# Patient Record
Sex: Female | Born: 1955 | Hispanic: No | Marital: Married | Smoking: Never smoker
Health system: Southern US, Community
[De-identification: ages and names within clinical notes are randomized; demographics above are authoritative.]

## PROBLEM LIST (undated history)

## (undated) DIAGNOSIS — R011 Cardiac murmur, unspecified: Secondary | ICD-10-CM

## (undated) HISTORY — PX: BUNIONECTOMY: SHX129

## (undated) HISTORY — PX: OTHER SURGICAL HISTORY: SHX169

## (undated) HISTORY — PX: KNEE SURGERY: SHX244

---

## 2004-03-17 ENCOUNTER — Ambulatory Visit: Payer: Self-pay | Admitting: Obstetrics & Gynecology

## 2004-06-16 ENCOUNTER — Ambulatory Visit: Payer: Self-pay | Admitting: Internal Medicine

## 2004-08-20 ENCOUNTER — Ambulatory Visit: Payer: Self-pay | Admitting: Obstetrics & Gynecology

## 2005-09-08 ENCOUNTER — Ambulatory Visit: Payer: Self-pay | Admitting: Obstetrics & Gynecology

## 2006-09-14 ENCOUNTER — Ambulatory Visit: Payer: Self-pay | Admitting: Obstetrics & Gynecology

## 2007-11-29 ENCOUNTER — Ambulatory Visit: Payer: Self-pay | Admitting: Obstetrics & Gynecology

## 2009-04-17 ENCOUNTER — Ambulatory Visit: Payer: Self-pay | Admitting: Obstetrics & Gynecology

## 2009-04-22 ENCOUNTER — Ambulatory Visit: Payer: Self-pay | Admitting: Specialist

## 2009-05-07 ENCOUNTER — Ambulatory Visit: Payer: Self-pay | Admitting: Specialist

## 2010-05-20 ENCOUNTER — Ambulatory Visit: Payer: Self-pay | Admitting: Obstetrics & Gynecology

## 2013-07-02 ENCOUNTER — Observation Stay: Payer: Self-pay | Admitting: Internal Medicine

## 2013-07-02 LAB — CBC
HCT: 40.2 % (ref 35.0–47.0)
HGB: 13.4 g/dL (ref 12.0–16.0)
MCH: 31 pg (ref 26.0–34.0)
MCHC: 33.4 g/dL (ref 32.0–36.0)
MCV: 93 fL (ref 80–100)
PLATELETS: 302 10*3/uL (ref 150–440)
RBC: 4.33 10*6/uL (ref 3.80–5.20)
RDW: 13.9 % (ref 11.5–14.5)
WBC: 9.7 10*3/uL (ref 3.6–11.0)

## 2013-07-02 LAB — TROPONIN I
Troponin-I: <0.02 ng/mL
Troponin-I: <0.02 ng/mL

## 2013-07-02 LAB — BASIC METABOLIC PANEL
Anion Gap: 3 — ABNORMAL LOW (ref 7–16)
BUN: 9 mg/dL (ref 7–18)
CHLORIDE: 107 mmol/L (ref 98–107)
CO2: 30 mmol/L (ref 21–32)
CREATININE: 0.63 mg/dL (ref 0.60–1.30)
Calcium, Total: 8.8 mg/dL (ref 8.5–10.1)
EGFR (Non-African Amer.): >60
Glucose: 89 mg/dL (ref 65–99)
Osmolality: 278 (ref 275–301)
Potassium: 3.7 mmol/L (ref 3.5–5.1)
Sodium: 140 mmol/L (ref 136–145)

## 2013-07-02 LAB — CK TOTAL AND CKMB (NOT AT ARMC)
CK, TOTAL: 77 U/L
CK, Total: 101 U/L
CK, Total: 84 U/L
CK-MB: 2.7 ng/mL (ref 0.5–3.6)
CK-MB: 2.2 ng/mL (ref 0.5–3.6)
CK-MB: 2 ng/mL (ref 0.5–3.6)

## 2013-07-03 DIAGNOSIS — R079 Chest pain, unspecified: Secondary | ICD-10-CM

## 2013-07-03 LAB — HEMOGLOBIN A1C: Hemoglobin A1C: 6 % (ref 4.2–6.3)

## 2013-07-03 LAB — BASIC METABOLIC PANEL
ANION GAP: 4 — AB (ref 7–16)
BUN: 11 mg/dL (ref 7–18)
CALCIUM: 8.8 mg/dL (ref 8.5–10.1)
Chloride: 109 mmol/L — ABNORMAL HIGH (ref 98–107)
Co2: 28 mmol/L (ref 21–32)
Creatinine: 0.67 mg/dL (ref 0.60–1.30)
EGFR (Non-African Amer.): >60
Glucose: 104 mg/dL — ABNORMAL HIGH (ref 65–99)
Osmolality: 281 (ref 275–301)
Potassium: 3.9 mmol/L (ref 3.5–5.1)
Sodium: 141 mmol/L (ref 136–145)

## 2013-07-03 LAB — TSH: Thyroid Stimulating Horm: 1.63 u[IU]/mL

## 2013-07-03 LAB — URINALYSIS, COMPLETE
Bacteria: NONE SEEN
Bilirubin,UR: NEGATIVE
Blood: NEGATIVE
GLUCOSE, UR: NEGATIVE mg/dL (ref 0–75)
KETONE: NEGATIVE
Leukocyte Esterase: NEGATIVE
Nitrite: NEGATIVE
Ph: 6 (ref 4.5–8.0)
Protein: NEGATIVE
RBC, UR: NONE SEEN /HPF (ref 0–5)
Specific Gravity: 1.008 (ref 1.003–1.030)
WBC UR: NONE SEEN /HPF (ref 0–5)

## 2013-07-03 LAB — CBC WITH DIFFERENTIAL/PLATELET
Basophil #: 0.1 10*3/uL (ref 0.0–0.1)
Basophil %: 1.1 %
EOS ABS: 0.9 10*3/uL — AB (ref 0.0–0.7)
Eosinophil %: 11.2 %
HCT: 39.6 % (ref 35.0–47.0)
HGB: 13.2 g/dL (ref 12.0–16.0)
Lymphocyte #: 2.2 10*3/uL (ref 1.0–3.6)
Lymphocyte %: 29.1 %
MCH: 31.1 pg (ref 26.0–34.0)
MCHC: 33.3 g/dL (ref 32.0–36.0)
MCV: 93 fL (ref 80–100)
MONOS PCT: 6.8 %
Monocyte #: 0.5 x10 3/mm (ref 0.2–0.9)
Neutrophil #: 4 10*3/uL (ref 1.4–6.5)
Neutrophil %: 51.8 %
PLATELETS: 292 10*3/uL (ref 150–440)
RBC: 4.24 10*6/uL (ref 3.80–5.20)
RDW: 14.1 % (ref 11.5–14.5)
WBC: 7.7 10*3/uL (ref 3.6–11.0)

## 2013-07-03 LAB — LIPID PANEL
CHOLESTEROL: 204 mg/dL — AB (ref 0–200)
HDL Cholesterol: 45 mg/dL (ref 40–60)
Ldl Cholesterol, Calc: 119 mg/dL — ABNORMAL HIGH (ref 0–100)
TRIGLYCERIDES: 198 mg/dL (ref 0–200)
VLDL Cholesterol, Calc: 40 mg/dL (ref 5–40)

## 2013-07-03 LAB — MAGNESIUM: Magnesium: 2 mg/dL

## 2014-07-27 NOTE — H&P (Signed)
PATIENT NAME:  Michele Bishop, BRANCA MR#:  [id number] DATE OF BIRTH:  25-Mar-1956  DATE OF ADMISSION:  07/02/2013  REASON FOR ADMISSION: Chest pain.   PRIMARY CARE PHYSICIAN: Benita Stabile, MD  HISTORY OF PRESENT ILLNESS: This is a very nice 59 year old female with history of being overall healthy, just postmenopausal. She is Freight forwarder at [employer] and today  she showed up to work being her normal self, was running trying to complete her and orders and apparently always is very busy and she is on her toes, never sits down, very active. The patient started having significant chest pain that happened around 6:30 a.m. The chest pain was located on her left upper chest radiating to the left shoulder and scapula. The pain resolved after 20 minutes and then continued to have some soreness. The patient had no shortness of breath associated to this, but she was feeling dizzy, lightheaded and very weak, not her normal self. After awhile she started to feel better and the pain relieved. No diaphoresis. No headaches or any other medical problems or problems today. The patient had negative troponins, was admitted for evaluation of acute coronary syndrome or CAD.   REVIEW OF SYSTEMS: A 12-systems review of systems is done.  CONSTITUTIONAL: No fever, fatigue, or weakness.  EYES: No blurry vision, double vision, pain or redness.   EARS, NOSE, THROAT: No tinnitus.  RESPIRATORY: No cough, wheezing, or hemoptysis.  CARDIOVASCULAR: No chest pain, orthopnea, or edema.  GASTROINTESTINAL: No nausea, vomiting, abdominal pain, constipation, diarrhea.  GENITOURINARY: No history of hematuria, or changes in frequency.  ENDOCRINE: No polyuria, polydipsia or polyphagia.  HEMATOLOGIC AND LYMPHATIC: No anemia, easy bruising.  SKIN: No rashes or petechiae.  MUSCULOSKELETAL: No significant neck pain, back pain, or gout.  NEUROLOGIC: No numbness, tingling, or TIAs.  PSYCHIATRIC: No anxiety or depression.   PAST SURGICAL HISTORY:  Left knee surgery due to meniscus tear and tubal ligation.   ALLERGIES: Not known drug allergies.   FAMILY HISTORY: No history of CVAs or MIs. No cancer on her family.   SOCIAL HISTORY: The patient does not smoke, does not drink. She is married. She is very active. She works really hard as a Freight forwarder on a moderately stressful position.   CURRENT MEDICATIONS: Include multivitamins daily and loratadine 10 mg once a day.   PAST MEDICAL HISTORY: The patient is overall healthy, just postmenopausal.   PHYSICAL EXAMINATION:  VITAL SIGNS: Blood pressure 124/75 at the beginning was 185/68, pulse 52. Right now her pulse is 90. Her temperature is 98, oxygen saturation 98% on room air.  GENERAL: The patient is alert, oriented x 3, in no acute distress. No respiratory distress. Hemodynamically stable.  HEENT: Pupils are equal and reactive. Extraocular movements are intact. Mucosa is moist. Anicteric sclerae. Pink conjunctivae. No oral lesions. No oropharyngeal exudates.  NECK: Supple. No JVD. No thyromegaly. No adenopathy. No carotid bruits.  CARDIOVASCULAR: Regular rate and rhythm. No murmurs, rubs, or gallops. No displacement of PMI. Positive reproduction of the pain on palpation at the level of the left chest on a single spot which she is on top of her breast. No displacement of PMI.  LUNGS: Clear without any wheezing or crepitus. No use of accessory muscles.  ABDOMEN: Soft, nontender, distended. No hepatosplenomegaly. No masses. Bowel sounds are positive.  GENITAL: Deferred.  EXTREMITIES: No edema, cyanosis, or clubbing. Pulses +2. Capillary refill less than 3.  NEUROLOGIC: Cranial nerves II through XII intact. Strength is 5 in 4 extremities. No focal findings.  PSYCHIATRIC: No significant depression, depression, or agitation. Alert and oriented x 3.  MUSCULOSKELETAL: No evident joint effusions or joint swelling.  LYMPHATIC: Negative for lymphadenopathy in the neck or supraclavicular  areas.  LABORATORY DATA RESULTS: Three sets of troponins are negative. Glucose is 89. Creatinine is 0.63. Other electrolytes were within normal limits. White count is 9.7, hemoglobin is 13 and platelet count is 302,000. Urinalysis is pending.  Chest x-ray: No significant active cardiopulmonary problems.   EKG: Normal sinus rhythm. No ST depression or elevation.   ASSESSMENT AND PLAN: This is a 59 year old female overall healthy just on a type A personality with high stressful job comes with new onset chest pain. The patient is postmenopausal, but not on estrogen replacement.  1. Chest pain. The patient had aspirin already, nitroglycerin has been ordered. Morphine p.r.n. for pain. Her pain has resolved and her cardiac enzymes are negative. Due to her significant stressful situation, type A personality, we are going to do a stress test in the morning. The patient had significant stress and pain that started with significant activity for what I think is worth to do a Lexiscan in the morning. Continue oxygen p.r.n. Check cholesterol. Start her on a statin. Stop if her cholesterol is normal or if her stress test is normal.  2. Hypertension. The patient had significant elevation of her blood pressure. We started her on metoprolol, which is going to help for any possible coronary issues. Her heart rate right now is 90s. Her blood pressure is starting to improve.  3. Deep vein thrombosis prophylaxis with Lovenox at prophylactic dose. Not need to do 1 mg/kg at this moment.  4. Gastrointestinal prophylaxis with Protonix. Continue the patient on a beta blocker, hydralazine p.r.n. for increased blood pressure. Consult cardiology if stress test is positive. Rule out any acute coronary issues.   CODE STATUS: The patient is a full code.   TIME SPENT: I spent about 45 minutes with this admission.     ____________________________ Geyser Sink, MD rsg:lt D: 07/02/2013 22:03:17 ET T: 07/02/2013  22:46:48 ET JOB#: [id number]  cc: McLoud Sink, MD, <Dictator> Clarissa Laird America Brown MD ELECTRONICALLY SIGNED 07/10/2013 21:10

## 2014-07-27 NOTE — Discharge Summary (Signed)
PATIENT NAME:  Michele Bishop, Michele Bishop MR#:  [id number] DATE OF BIRTH:  [birth year]  DATE OF ADMISSION:  07/02/2013 DATE OF DISCHARGE:  07/03/2013  ADMITTING DIAGNOSIS: Chest pain.   DISCHARGE DIAGNOSES: 1.  Chest pain, felt to be atypical in nature status post Lexiscan, which was negative for ischemia.  2.  Accelerated blood pressure on presentation. The blood pressure improved with treatment.   PERTINENT LABS AND EVALUATIONS: Admitting glucose 89, BUN 9, creatinine 0.63, sodium 140, potassium 3.7, chloride 107. CO2 is 30, calcium 8.8. Troponin was less than 0.2 x 3. TSH 1.63. WBC 9.7, hemoglobin 13.4, platelet count is 302.  Myocardial scan showed no significant wall motion abnormality. No EKG changes. EKG on presentation showed sinus bradycardia, minimal  voltage for LVH.   HOSPITAL COURSE: The patient is a 59 year old white female with history of no other medical problems, who presented with complaint of left-sided chest pain.  . The patient is under a lot of stress. Due to these symptoms, she was admitted and evaluated. Underwent serial cardiac enzymes which remain negative. EKG was negative. She was admitted to the hospital and underwent a stress MIBI. There was no evidence of ischemia or infarct. Her chest pain was felt to be likely musculoskeletal and noncardiac in origin. The patient was also noticed to have elevated blood pressure on presentation, which was treated with a beta blocker. Now, her blood pressure is improved. She has no longer chest pain, feeling well and is stable for discharge.   DISCHARGE MEDICATIONS: Loratadine 10 daily, aspirin 325 daily, atenolol 25 daily.   DIET: Low sodium.   ACTIVITY: As tolerated.   Follow up with primary M.D. in 1 to 2 weeks. The patient to check blood pressure every day, to keep a record to take to primary M.D. until blood pressure is stable.  TIME SPENT: 35 minutes.     ____________________________ Lafonda Mosses Posey Pronto, MD shp:dmm D: 07/04/2013  09:07:44 ET T: 07/04/2013 09:55:20 ET JOB#: [id number]  cc: Noble Bodie H. Posey Pronto, MD, <Dictator> Alric Seton MD ELECTRONICALLY SIGNED 07/13/2013 8:48

## 2015-06-06 ENCOUNTER — Other Ambulatory Visit: Payer: Self-pay | Admitting: Internal Medicine

## 2015-06-06 DIAGNOSIS — R55 Syncope and collapse: Secondary | ICD-10-CM

## 2015-06-11 ENCOUNTER — Ambulatory Visit
Admission: RE | Admit: 2015-06-11 | Discharge: 2015-06-11 | Disposition: A | Discharge status: Home / Self Care | Payer: No Typology Code available for payment source | Source: Ambulatory Visit | Attending: Internal Medicine | Admitting: Internal Medicine

## 2015-06-11 DIAGNOSIS — R55 Syncope and collapse: Secondary | ICD-10-CM

## 2015-06-11 DIAGNOSIS — I6522 Occlusion and stenosis of left carotid artery: Secondary | ICD-10-CM | POA: Diagnosis not present

## 2015-06-11 NOTE — Progress Notes (Signed)
*  PRELIMINARY RESULTS* Echocardiogram 2D Echocardiogram has been performed.  Michele Bishop 06/11/2015, 10:27 AM

## 2016-04-12 ENCOUNTER — Emergency Department
Admission: EM | Admit: 2016-04-12 | Discharge: 2016-04-12 | Disposition: A | Discharge status: Home / Self Care | Payer: BLUE CROSS/BLUE SHIELD | Attending: Student in an Organized Health Care Education/Training Program | Admitting: Student in an Organized Health Care Education/Training Program

## 2016-04-12 ENCOUNTER — Encounter: Payer: Self-pay | Admitting: Emergency Medicine

## 2016-04-12 DIAGNOSIS — Y929 Unspecified place or not applicable: Secondary | ICD-10-CM | POA: Diagnosis not present

## 2016-04-12 DIAGNOSIS — Y999 Unspecified external cause status: Secondary | ICD-10-CM | POA: Insufficient documentation

## 2016-04-12 DIAGNOSIS — M62838 Other muscle spasm: Secondary | ICD-10-CM

## 2016-04-12 DIAGNOSIS — X58XXXA Exposure to other specified factors, initial encounter: Secondary | ICD-10-CM | POA: Insufficient documentation

## 2016-04-12 DIAGNOSIS — Y939 Activity, unspecified: Secondary | ICD-10-CM | POA: Insufficient documentation

## 2016-04-12 DIAGNOSIS — S46812A Strain of other muscles, fascia and tendons at shoulder and upper arm level, left arm, initial encounter: Secondary | ICD-10-CM | POA: Diagnosis not present

## 2016-04-12 DIAGNOSIS — S4992XA Unspecified injury of left shoulder and upper arm, initial encounter: Secondary | ICD-10-CM | POA: Diagnosis present

## 2016-04-12 HISTORY — DX: Cardiac murmur, unspecified: R01.1

## 2016-04-12 MED ORDER — MELOXICAM 7.5 MG PO TABS: 7.5000 mg | ORAL_TABLET | Freq: Every day | ORAL | 1 refills | Status: AC

## 2016-04-12 MED ORDER — CYCLOBENZAPRINE HCL 5 MG PO TABS: 5.0000 mg | ORAL_TABLET | Freq: Three times a day (TID) | ORAL | 0 refills | Status: AC | PRN

## 2016-04-12 NOTE — ED Triage Notes (Signed)
Pt presents with pain in her left arm and neck. States that she had back pain before this and now it is her arm with some pain in her neck. Pt denies chest pain, sob. She has had cough and cold x 1 week. Pt alert & oriented with NAD noted.

## 2016-04-12 NOTE — ED Notes (Signed)
Pt states cold x 10 days, back/neck/L arm pain x 7 days. Pt states she is coughing with small amount of nasal drainage. Pt denies fever. Alert and oriented x 4. No distress noted at this time.

## 2016-04-13 NOTE — ED Provider Notes (Signed)
[facility] Emergency Department Provider Note  ____________________________________________  Time seen: Approximately 12:23 AM  I have reviewed the triage vital signs and the nursing notes.   HISTORY  Chief Complaint Arm Pain and Neck Pain    HPI Michele Bishop is a 61 y.o. female presenting to the emergency department with left upper trapezius pain. Patient states that she had a cold approximately 10 days ago. Patient states that she was lying in bed and on the couch most of the 10 days. Patient states that she became very stiff. She states that left upper trapezius pain is keeping her from resting effectively at night. She denies chest pain, nausea, vomiting and radiculopathy. Patient has taken Motrin, which has partially relieved her symptoms. She currently rates her trapezius pain at 5/10.    Past Medical History:  Diagnosis Date  . Heart murmur     There are no active problems to display for this patient.   Past Surgical History:  Procedure Laterality Date  . KNEE SURGERY    . uterine ablation      Prior to Admission medications   Medication Sig Start Date End Date Taking? Authorizing Provider  cyclobenzaprine (FLEXERIL) 5 MG tablet Take 1 tablet (5 mg total) by mouth 3 (three) times daily as needed for muscle spasms. 04/12/16 04/17/16  Lannie Fields, PA-C  meloxicam (MOBIC) 7.5 MG tablet Take 1 tablet (7.5 mg total) by mouth daily. 04/12/16 04/19/16  Lannie Fields, PA-C    Allergies Augmentin [amoxicillin-pot clavulanate]  History reviewed. No pertinent family history.  Social History Social History  Substance Use Topics  . Smoking status: Never Smoker  . Smokeless tobacco: Never Used  . Alcohol use No     Comment: occasional      Review of Systems  Constitutional: No fever/chills Eyes: No visual changes. No discharge. Cardiovascular: no chest pain. Respiratory: no cough. No SOB. Gastrointestinal: No abdominal pain.  No  nausea, no vomiting.  No diarrhea.  No constipation. Musculoskeletal: Patient has left upper trapezius pain.  Skin: Negative for rash, abrasions, lacerations, ecchymosis. Neurological: Negative for headaches, focal weakness or numbness. 10-point ROS otherwise negative.  ____________________________________________   PHYSICAL EXAM:  VITAL SIGNS: ED Triage Vitals  Enc Vitals Group     BP 04/12/16 1816 (!) 165/64     Pulse Rate 04/12/16 1816 94     Resp 04/12/16 1816 18     Temp 04/12/16 1816 99.6 F (37.6 C)     Temp Source 04/12/16 1816 Oral     SpO2 04/12/16 1816 96 %     Weight 04/12/16 1815 159 lb (72.1 kg)     Height 04/12/16 1815 5\' 3"  (1.6 m)     Head Circumference --      Peak Flow --      Pain Score 04/12/16 1829 7     Pain Loc --      Pain Edu? --      Excl. in [place]? --     Constitutional: Alert and oriented. Patient is talkative and engaged.  Eyes: Palpebral and bulbar conjunctiva are nonerythematous bilaterally. PERRL. EOMI. No scleral icterus bilaterally. Head: Atraumatic. Cardiovascular: No scars of the skin overlying the anterior or posterior chest wall. No pain with palpation over the anterior and posterior chest wall. Normal rate, regular rhythm. Normal S1 and S2. No murmurs, gallops or rubs auscultated.  Respiratory:  On auscultation, adventitious sounds are absent.  Musculoskeletal: Patient has 5/5 strength in the upper and  lower extremities bilaterally. Full range of motion at the shoulder, elbow and wrist bilaterally. Full range of motion at the hip, knee and ankle bilaterally. No changes in gait. She has tenderness to palpation along the left upper trapezius. Patient has no radiculopathy ellicted with range of motion at the neck. Palpable radial and ulnar pulses bilaterally.  Neurologic:  Normal for age. No gross focal neurologic deficits are appreciated. Reflexes are 2+ and symmetric in the upper extremities bilaterally. Skin:  Skin is warm, dry and intact. No  rash noted. No clubbing or cyanosis of the digits visualized.  Psychiatric: Mood and affect are normal for age. Speech and behavior are normal.    ____________________________________________   LABS (all labs ordered are listed, but only abnormal results are displayed)  Labs Reviewed - No data to display ____________________________________________  EKG   ____________________________________________  RADIOLOGY   No results found.  ____________________________________________    PROCEDURES  Procedure(s) performed:    Procedures    Medications - No data to display   ____________________________________________   INITIAL IMPRESSION / ASSESSMENT AND PLAN / ED COURSE  Pertinent labs & imaging results that were available during my care of the patient were reviewed by me and considered in my medical decision making (see chart for details).  Review of the [place] CSRS was performed in accordance of the [place] prior to dispensing any controlled drugs.  Clinical Course     Assessment and Plan:  Trapezius Muscle Spasm Patient presents to the emergency department with left upper trapezius pain. Patient has recently been lying supine more than usual due to a recent cold. Patient denies chest pain, shortness of breath, nausea or vomiting. Trapezius spasm is likely. Patient was discharged with Flexeril and Mobic to be used for pain and inflammation. A referral was made to orthopedics, Dr. Mack Guise. Patient was advised to make an appointment in 7 days if trapezius pain persists. All patient questions were answered.    ____________________________________________  FINAL CLINICAL IMPRESSION(S) / ED DIAGNOSES  Final diagnoses:  Trapezius muscle spasm      NEW MEDICATIONS STARTED DURING THIS VISIT:  Discharge Medication List as of 04/12/2016  7:42 PM    START taking these medications   Details  cyclobenzaprine (FLEXERIL) 5 MG tablet Take 1 tablet (5 mg total) by mouth 3  (three) times daily as needed for muscle spasms., Starting Mon 04/12/2016, Until Sat 04/17/2016, Print    meloxicam (MOBIC) 7.5 MG tablet Take 1 tablet (7.5 mg total) by mouth daily., Starting Mon 04/12/2016, Until Mon 04/19/2016, Print            This chart was dictated using voice recognition software/Dragon. Despite best efforts to proofread, errors can occur which can change the meaning. Any change was purely unintentional.    Lannie Fields, PA-C 04/13/16 0033    Merlyn Lot, MD 04/13/16 [phone]

## 2016-06-01 ENCOUNTER — Other Ambulatory Visit: Payer: Self-pay | Admitting: Obstetrics and Gynecology

## 2016-06-01 DIAGNOSIS — Z1231 Encounter for screening mammogram for malignant neoplasm of breast: Secondary | ICD-10-CM

## 2016-06-30 NOTE — H&P (Signed)
Michele Bishop is a 61 y.o. female here for Fractional dilation and curettage. for f/up for a 4 mm endometrial polyp seen on u/s . EMBX done with no endometrial tissue identified on bx .  s/p ablation 2006 . No PMB    Past Medical History:  has a past medical history of Acute headache; GERD (gastroesophageal reflux disease); Menstrual abnormality, unspecified; and Shingles.  Past Surgical History:  has a past surgical history that includes Colonoscopy (06/16/2004); Tubal ligation, 1981; Dilation and curettage, diagnostic / therapeutic; Foot surgical procedure; Knee surgery, arthroscopic; and Hysteroscopy w/ endometrial ablation (2006). Family History: family history includes Colon polyps in her father; Diabetes type II in her brother, father, and paternal aunt; High blood pressure (Hypertension) in her father; Lymphoma in her father; No Known Problems in her daughter and son; Skin cancer in her sister; Stroke in her sister. Social History:  reports that she has never smoked. She has never used smokeless tobacco. She reports that she drinks alcohol. She reports that she does not use drugs. OB/GYN History:          OB History    Gravida Para Term Preterm AB Living   2 2       2    SAB TAB Ectopic Molar Multiple Live Births             2      Allergies: is allergic to augmentin [amoxicillin-pot clavulanate]. Medications: No current outpatient prescriptions on file.  Review of Systems: General:                      No fatigue or weight loss Eyes:                           No vision changes Ears:                            No hearing difficulty Respiratory:                No cough or shortness of breath Pulmonary:                  No asthma or shortness of breath Cardiovascular:           No chest pain, palpitations, dyspnea on exertion Gastrointestinal:          No abdominal bloating, chronic diarrhea, constipations, masses, pain or hematochezia Genitourinary:             No  hematuria, dysuria, abnormal vaginal discharge, pelvic pain, Menometrorrhagia Lymphatic:                   No swollen lymph nodes Musculoskeletal:         No muscle weakness Neurologic:                  No extremity weakness, syncope, seizure disorder Psychiatric:                  No history of depression, delusions or suicidal/homicidal ideation    Exam:      Vitals:   06/29/16 1344  BP: 133/69  Pulse: 68    Body mass index is 29.63 kg/m.  WDWN white/ female in NAD   Lungs: CTA  CV : RRR without murmur   Neck:  no thyromegaly Abdomen: soft , no mass, normal active bowel sounds,  non-tender, no rebound  tenderness Pelvic : v/v nl  cx no lesions  UTX : NSSC  Adnexa : no mass , non tender    Impression:   The primary encounter diagnosis was Endometrium, polyp. A diagnosis of Cervical os stenosis was also pertinent to this visit.    Plan:   Discussed option of Fx D+ H/S Benefits and risks to surgery: The proposed benefit of the surgery has been discussed with the patient. The possible risks include, but are not limited to: organ injury to the bowel , bladder, ureters, and major blood vessels and nerves. There is a possibility of additional surgeries resulting from these injuries. There is also the risk of blood transfusion and the need to receive blood products during or after the procedure which may rarely lead to HIV or Hepatitis C infection. There is a risk of developing a deep venous thrombosis or a pulmonary embolism . There is the possibility of wound infection and also anesthetic complications, even the rare possibility of death. The patient understands these risks and wishes to proceed. All questions have been answered and the consent has been signed.   Return if symptoms worsen or fail to improve, for preop.  Caroline Sauger, MD

## 2016-07-02 ENCOUNTER — Encounter
Admission: RE | Admit: 2016-07-02 | Discharge: 2016-07-02 | Disposition: A | Discharge status: Home / Self Care | Payer: No Typology Code available for payment source | Source: Ambulatory Visit | Attending: Obstetrics and Gynecology | Admitting: Obstetrics and Gynecology

## 2016-07-02 NOTE — Patient Instructions (Signed)
  Your procedure is scheduled on: 07-09-16 (Friday) Report to Same Day Surgery 2nd floor medical mall [facility] Entrance-take elevator on left to 2nd floor.  Check in with surgery information desk.) To find out your arrival time please call [phone] between 1PM - 3PM on 07-08-16 (Thursday)  Remember: Instructions that are not followed completely may result in serious medical risk, up to and including death, or upon the discretion of your surgeon and anesthesiologist your surgery may need to be rescheduled.    _x___ 1. Do not eat food or drink liquids after midnight. No gum chewing or hard candies.     __x__ 2. No Alcohol for 24 hours before or after surgery.   __x__3. No Smoking for 24 prior to surgery.   ____  4. Bring all medications with you on the day of surgery if instructed.    __x__ 5. Notify your doctor if there is any change in your medical condition     (cold, fever, infections).     Do not wear jewelry, make-up, hairpins, clips or nail polish.  Do not wear lotions, powders, or perfumes. You may wear deodorant.  Do not shave 48 hours prior to surgery. Men may shave face and neck.  Do not bring valuables to the hospital.    [facility] is not responsible for any belongings or valuables.               Contacts, dentures or bridgework may not be worn into surgery.  Leave your suitcase in the car. After surgery it may be brought to your room.  For patients admitted to the hospital, discharge time is determined by your treatment team.   Patients discharged the day of surgery will not be allowed to drive home.  You will need someone to drive you home and stay with you the night of your procedure.    Please read over the following fact sheets that you were given:    ____ Take anti-hypertensive (unless it includes a diuretic), cardiac, seizure, asthma,     anti-reflux and psychiatric medicines. These include:  1. NONE  2.  3.  4.  5.  6.  ____Fleets enema or Magnesium  Citrate as directed.   ____ Use CHG Soap or sage wipes as directed on instruction sheet   ____ Use inhalers on the day of surgery and bring to hospital day of surgery  ____ Stop Metformin and Janumet 2 days prior to surgery.    ____ Take 1/2 of usual insulin dose the night before surgery and none on the morning     surgery.   ____ Follow recommendations from Cardiologist, Pulmonologist or PCP regarding stopping Aspirin, Coumadin, Pllavix ,Eliquis, Effient, or Pradaxa, and Pletal.  X____Stop Anti-inflammatories such as Advil, Aleve, Ibuprofen, Motrin, Naproxen, Naprosyn, Goodies powders or aspirin products NOW-OK to take Tylenol    ____ Stop supplements until after surgery.     ____ Bring C-Pap to the hospital.

## 2016-07-06 ENCOUNTER — Ambulatory Visit
Admission: RE | Admit: 2016-07-06 | Discharge: 2016-07-06 | Disposition: A | Discharge status: Home / Self Care | Payer: BLUE CROSS/BLUE SHIELD | Source: Ambulatory Visit | Attending: Obstetrics and Gynecology | Admitting: Obstetrics and Gynecology

## 2016-07-06 ENCOUNTER — Encounter
Admission: RE | Admit: 2016-07-06 | Discharge: 2016-07-06 | Disposition: A | Discharge status: Home / Self Care | Payer: BLUE CROSS/BLUE SHIELD | Source: Ambulatory Visit

## 2016-07-06 ENCOUNTER — Other Ambulatory Visit: Payer: Self-pay | Admitting: Obstetrics and Gynecology

## 2016-07-06 DIAGNOSIS — Z1231 Encounter for screening mammogram for malignant neoplasm of breast: Secondary | ICD-10-CM | POA: Diagnosis not present

## 2016-07-06 DIAGNOSIS — Z01812 Encounter for preprocedural laboratory examination: Secondary | ICD-10-CM | POA: Insufficient documentation

## 2016-07-06 LAB — BASIC METABOLIC PANEL
Anion gap: 7 (ref 5–15)
BUN: 11 mg/dL (ref 6–20)
CALCIUM: 9.4 mg/dL (ref 8.9–10.3)
CO2: 27 mmol/L (ref 22–32)
CREATININE: 0.46 mg/dL (ref 0.44–1.00)
Chloride: 104 mmol/L (ref 101–111)
GFR calc Af Amer: >60 mL/min (ref >60)
GLUCOSE: 93 mg/dL (ref 65–99)
Potassium: 3.8 mmol/L (ref 3.5–5.1)
Sodium: 138 mmol/L (ref 135–145)

## 2016-07-06 LAB — CBC
HCT: 39.4 % (ref 35.0–47.0)
Hemoglobin: 13.4 g/dL (ref 12.0–16.0)
MCH: 30.8 pg (ref 26.0–34.0)
MCHC: 34 g/dL (ref 32.0–36.0)
MCV: 90.5 fL (ref 80.0–100.0)
PLATELETS: 309 10*3/uL (ref 150–440)
RBC: 4.36 MIL/uL (ref 3.80–5.20)
RDW: 14.4 % (ref 11.5–14.5)
WBC: 7.9 10*3/uL (ref 3.6–11.0)

## 2016-07-06 LAB — TYPE AND SCREEN
ABO/RH(D): O POS
ANTIBODY SCREEN: NEGATIVE

## 2016-07-08 ENCOUNTER — Encounter: Payer: Self-pay | Admitting: *Deleted

## 2016-07-09 ENCOUNTER — Ambulatory Visit: Payer: BLUE CROSS/BLUE SHIELD | Admitting: Anesthesiology

## 2016-07-09 ENCOUNTER — Encounter
Admission: RE | Disposition: A | Discharge status: Home / Self Care | Payer: Self-pay | Source: Ambulatory Visit | Attending: Obstetrics and Gynecology

## 2016-07-09 ENCOUNTER — Encounter: Payer: Self-pay | Admitting: Anesthesiology

## 2016-07-09 ENCOUNTER — Ambulatory Visit
Admission: RE | Admit: 2016-07-09 | Discharge: 2016-07-09 | Disposition: A | Discharge status: Home / Self Care | Payer: BLUE CROSS/BLUE SHIELD | Source: Ambulatory Visit | Attending: Obstetrics and Gynecology | Admitting: Obstetrics and Gynecology

## 2016-07-09 DIAGNOSIS — Z8371 Family history of colonic polyps: Secondary | ICD-10-CM | POA: Diagnosis not present

## 2016-07-09 DIAGNOSIS — K219 Gastro-esophageal reflux disease without esophagitis: Secondary | ICD-10-CM | POA: Diagnosis not present

## 2016-07-09 DIAGNOSIS — N840 Polyp of corpus uteri: Secondary | ICD-10-CM | POA: Insufficient documentation

## 2016-07-09 HISTORY — PX: HYSTEROSCOPY WITH D & C: SHX1775

## 2016-07-09 SURGERY — DILATATION AND CURETTAGE /HYSTEROSCOPY
Anesthesia: General

## 2016-07-09 MED ORDER — ONDANSETRON HCL 4 MG/2ML IJ SOLN: 4.0000 mg | Freq: Once | INTRAMUSCULAR | Status: DC | PRN

## 2016-07-09 MED ORDER — LACTATED RINGERS IV SOLN
INTRAVENOUS | Status: DC
  Administered 2016-07-09 (×2): via INTRAVENOUS

## 2016-07-09 MED ORDER — MIDAZOLAM HCL 2 MG/2ML IJ SOLN
INTRAMUSCULAR | Status: AC
  Filled 2016-07-09: qty 2

## 2016-07-09 MED ORDER — FAMOTIDINE 20 MG PO TABS
20.0000 mg | ORAL_TABLET | Freq: Once | ORAL | Status: AC
  Administered 2016-07-09: 20 mg via ORAL

## 2016-07-09 MED ORDER — EPHEDRINE SULFATE 50 MG/ML IJ SOLN
INTRAMUSCULAR | Status: DC | PRN
  Administered 2016-07-09: 10 mg via INTRAVENOUS

## 2016-07-09 MED ORDER — MIDAZOLAM HCL 2 MG/2ML IJ SOLN
INTRAMUSCULAR | Status: DC | PRN
Start: 2016-07-09 — End: 2016-07-09
  Administered 2016-07-09: 2 mg via INTRAVENOUS

## 2016-07-09 MED ORDER — LACTATED RINGERS IV SOLN: INTRAVENOUS | Status: DC

## 2016-07-09 MED ORDER — PROPOFOL 10 MG/ML IV BOLUS
INTRAVENOUS | Status: DC | PRN
  Administered 2016-07-09: 170 mg via INTRAVENOUS

## 2016-07-09 MED ORDER — FAMOTIDINE 20 MG PO TABS
ORAL_TABLET | ORAL | Status: AC
  Administered 2016-07-09: 20 mg via ORAL
  Filled 2016-07-09: qty 1

## 2016-07-09 MED ORDER — GLYCOPYRROLATE 0.2 MG/ML IJ SOLN
INTRAMUSCULAR | Status: DC | PRN
  Administered 2016-07-09: .2 mg via INTRAVENOUS

## 2016-07-09 MED ORDER — ONDANSETRON HCL 4 MG/2ML IJ SOLN
INTRAMUSCULAR | Status: DC | PRN
  Administered 2016-07-09: 4 mg via INTRAVENOUS

## 2016-07-09 MED ORDER — DEXTROSE 5 % IV SOLN
1.0000 g | INTRAVENOUS | Status: AC
  Administered 2016-07-09: 1 g via INTRAVENOUS
  Filled 2016-07-09: qty 1

## 2016-07-09 MED ORDER — FENTANYL CITRATE (PF) 100 MCG/2ML IJ SOLN
INTRAMUSCULAR | Status: DC | PRN
  Administered 2016-07-09 (×2): 50 ug via INTRAVENOUS

## 2016-07-09 MED ORDER — DEXAMETHASONE SODIUM PHOSPHATE 10 MG/ML IJ SOLN
INTRAMUSCULAR | Status: DC | PRN
  Administered 2016-07-09: 10 mg via INTRAVENOUS

## 2016-07-09 MED ORDER — FENTANYL CITRATE (PF) 100 MCG/2ML IJ SOLN
INTRAMUSCULAR | Status: AC
  Filled 2016-07-09: qty 2

## 2016-07-09 MED ORDER — PROPOFOL 10 MG/ML IV BOLUS
INTRAVENOUS | Status: AC
  Filled 2016-07-09: qty 20

## 2016-07-09 MED ORDER — FENTANYL CITRATE (PF) 100 MCG/2ML IJ SOLN: 25.0000 ug | INTRAMUSCULAR | Status: DC | PRN

## 2016-07-09 MED ORDER — SILVER NITRATE-POT NITRATE 75-25 % EX MISC
CUTANEOUS | Status: AC
  Filled 2016-07-09: qty 4

## 2016-07-09 SURGICAL SUPPLY — 15 items
CANISTER SUCT 3000ML (MISCELLANEOUS) ×3 IMPLANT
CATH ROBINSON RED A/P 16FR (CATHETERS) ×3 IMPLANT
GLOVE BIO SURGEON STRL SZ8 (GLOVE) ×12 IMPLANT
GOWN STRL REUS W/ TWL LRG LVL3 (GOWN DISPOSABLE) ×1 IMPLANT
GOWN STRL REUS W/ TWL XL LVL3 (GOWN DISPOSABLE) ×1 IMPLANT
GOWN STRL REUS W/TWL LRG LVL3 (GOWN DISPOSABLE) ×2
GOWN STRL REUS W/TWL XL LVL3 (GOWN DISPOSABLE) ×2
IV LACTATED RINGERS 1000ML (IV SOLUTION) ×3 IMPLANT
KIT RM TURNOVER CYSTO AR (KITS) ×3 IMPLANT
PACK DNC HYST (MISCELLANEOUS) ×3 IMPLANT
PAD OB MATERNITY 4.3X12.25 (PERSONAL CARE ITEMS) ×3 IMPLANT
PAD PREP 24X41 OB/GYN DISP (PERSONAL CARE ITEMS) ×3 IMPLANT
TOWEL OR 17X26 4PK STRL BLUE (TOWEL DISPOSABLE) ×3 IMPLANT
TUBING CONNECTING 10 (TUBING) ×2 IMPLANT
TUBING CONNECTING 10' (TUBING) ×1

## 2016-07-09 NOTE — Anesthesia Post-op Follow-up Note (Cosign Needed)
Anesthesia QCDR form completed.        

## 2016-07-09 NOTE — Anesthesia Procedure Notes (Signed)
Procedure Name: LMA Insertion Date/Time: 07/09/2016 10:45 AM Performed by: Allean Found Pre-anesthesia Checklist: Patient identified, Emergency Drugs available, Suction available, Patient being monitored and Timeout performed Patient Re-evaluated:Patient Re-evaluated prior to inductionOxygen Delivery Method: Circle system utilized Preoxygenation: Pre-oxygenation with 100% oxygen Intubation Type: IV induction Ventilation: Mask ventilation without difficulty LMA: LMA inserted LMA Size: 4.0 Number of attempts: 1 Placement Confirmation: ETT inserted through vocal cords under direct vision,  positive ETCO2 and breath sounds checked- equal and bilateral Dental Injury: Teeth and Oropharynx as per pre-operative assessment

## 2016-07-09 NOTE — Progress Notes (Signed)
Pt ready for Fx D+C  For 4 mm endometrial polyp and inability to sample lining innoffice due to post ablation . NPO . All questions answered

## 2016-07-09 NOTE — Op Note (Signed)
NAME:  Friscia, Cheryn               ACCOUNT NO.:  MEDICAL RECORD NO.:  [id number]  LOCATION:                                 FACILITY:  PHYSICIAN:  Laverta Baltimore, MD     DATE OF BIRTH:  DATE OF PROCEDURE: DATE OF DISCHARGE:                              OPERATIVE REPORT   PREOPERATIVE DIAGNOSIS:  Endometrial polyp.  POSTOPERATIVE DIAGNOSIS:  Endometrial polyp.  PROCEDURE PERFORMED: 1. Cervical dilation. 2. Endocervical curettage. 3. Hysteroscopy. 4. Endometrial sampling.  SURGEON:  Laverta Baltimore, MD  SURGEON:  Laverta Baltimore, MD.  FIRST ASSISTANT:  Doristine Mango, PA student.  ANESTHESIA:  General endotracheal anesthesia.  INDICATION:  A 61 year old female.  The patient was noted to have a 4 mm endometrial polyp seen on ultrasound and the patient underwent endometrial biopsy in the office that showed insufficient tissue.  The patient is status post an endometrial ablation in 2006.  DESCRIPTION OF PROCEDURE:  After adequate general endotracheal anesthesia, the patient was placed in dorsal supine position with the legs in the candy-cane stirrups.  Perineal and vaginal prep was performed.  A time-out was performed.  The patient did receive 1 g of IV cefoxitin prior to commencement of the case.  Straight catheterization of the bladder yielded 100 mL clear urine.  A weighted speculum was placed in the posterior vaginal vault.  The anterior cervix was grasped with a single-tooth tenaculum.  Cervical stenosis was identified.  The cervix was gently dilated to a #16 Hanks dilator without difficulty. Endocervical curettage was performed with scant tissue.  Uterine sound to 11 cm.  Hysteroscope was advanced into the endometrial cavity.  There was atypical appearance of the endometrium given prior ablation.  There were adhesions noted.  Hysteroscope was removed and Randall stone forceps was used to attempt to retrieve a small polyp that was identified at  hysteroscope.  Fibrous tissue was removed and at this point, the surgeon felt uncomfortable with the appearance of the endometrial cavity and the amount of scar tissue and therefore no additional sampling of the endometrial cavity was performed given the atypical appearance of the endometrial cavity.  Good hemostasis.  The procedure was terminated.  There were no complications.  ESTIMATED BLOOD LOSS:  Minimal.  INTRAOPERATIVE FLUIDS:  400 mL.  URINE OUTPUT:  100 mL.  The patient tolerated the procedure well and was taken to recovery room in good condition.          ______________________________ Laverta Baltimore, MD     TS/MEDQ  D:  07/09/2016  T:  07/09/2016  Job:  [id number]

## 2016-07-09 NOTE — Anesthesia Postprocedure Evaluation (Signed)
Anesthesia Post Note  Patient: Michele Bishop  Procedure(s) Performed: Procedure(s) (LRB): DILATATION AND CURETTAGE /HYSTEROSCOPY (N/A)  Patient location during evaluation: PACU Anesthesia Type: General Level of consciousness: awake and alert Pain management: pain level controlled Vital Signs Assessment: post-procedure vital signs reviewed and stable Respiratory status: spontaneous breathing, nonlabored ventilation, respiratory function stable and patient connected to nasal cannula oxygen Cardiovascular status: blood pressure returned to baseline and stable Postop Assessment: no signs of nausea or vomiting Anesthetic complications: no     Last Vitals:  Vitals:   07/09/16 1204 07/09/16 1213  BP: 137/60 (!) 129/53  Pulse: (!) 54 63  Resp: 14 16  Temp:  36.1 C    Last Pain:  Vitals:   07/09/16 1213  TempSrc:   PainSc: 0-No pain                 Kathey Simer S

## 2016-07-09 NOTE — Discharge Instructions (Signed)
AMBULATORY SURGERY  °DISCHARGE INSTRUCTIONS ° ° °1) The drugs that you were given will stay in your system until tomorrow so for the next 24 hours you should not: ° °A) Drive an automobile °B) Make any legal decisions °C) Drink any alcoholic beverage ° ° °2) You may resume regular meals tomorrow.  Today it is better to start with liquids and gradually work up to solid foods. ° °You may eat anything you prefer, but it is better to start with liquids, then soup and crackers, and gradually work up to solid foods. ° ° °3) Please notify your doctor immediately if you have any unusual bleeding, trouble breathing, redness and pain at the surgery site, drainage, fever, or pain not relieved by medication. ° ° ° °4) Additional Instructions: ° ° ° ° ° ° ° °Please contact your physician with any problems or Same Day Surgery at [phone], Monday through Friday 6 am to 4 pm, or [place] [facility] Main number at [phone]. °

## 2016-07-09 NOTE — Transfer of Care (Signed)
Immediate Anesthesia Transfer of Care Note  Patient: Michele Bishop  Procedure(s) Performed: Procedure(s): DILATATION AND CURETTAGE /HYSTEROSCOPY (N/A)  Patient Location: PACU  Anesthesia Type:General  Level of Consciousness: awake, alert  and oriented  Airway & Oxygen Therapy: Patient Spontanous Breathing and Patient connected to face mask oxygen  Post-op Assessment: Report given to RN and Post -op Vital signs reviewed and stable  Post vital signs: Reviewed and stable  Last Vitals:  Vitals:   07/09/16 0845 07/09/16 1134  BP: (!) 147/54 129/70  Pulse: (!) 59 85  Resp: 16 10  Temp: 36.6 C 36.2 C    Last Pain:  Vitals:   07/09/16 1134  TempSrc:   PainSc: 1          Complications: No apparent anesthesia complications

## 2016-07-09 NOTE — Anesthesia Preprocedure Evaluation (Signed)
Anesthesia Evaluation  Patient identified by MRN, date of birth, ID band Patient awake    Reviewed: Allergy & Precautions, NPO status , Patient's Chart, lab work & pertinent test results, reviewed documented beta blocker date and time   Airway Mallampati: II  TM Distance: >3 FB     Dental  (+) Chipped   Pulmonary           Cardiovascular      Neuro/Psych    GI/Hepatic   Endo/Other    Renal/GU      Musculoskeletal   Abdominal   Peds  Hematology   Anesthesia Other Findings   Reproductive/Obstetrics                             Anesthesia Physical Anesthesia Plan  ASA: II  Anesthesia Plan: General   Post-op Pain Management:    Induction: Intravenous  Airway Management Planned: LMA  Additional Equipment:   Intra-op Plan:   Post-operative Plan:   Informed Consent: I have reviewed the patients History and Physical, chart, labs and discussed the procedure including the risks, benefits and alternatives for the proposed anesthesia with the patient or authorized representative who has indicated his/her understanding and acceptance.     Plan Discussed with: CRNA  Anesthesia Plan Comments:         Anesthesia Quick Evaluation

## 2016-07-09 NOTE — Brief Op Note (Signed)
07/09/2016  11:20 AM  PATIENT:  Michele Bishop  61 y.o. female  PRE-OPERATIVE DIAGNOSIS:  Endometrial Polyp, cervical os stenosis  POST-OPERATIVE DIAGNOSIS:  endometrial polyp  PROCEDURE:  Cervical dilation , ecc, hysteroscopy  SURGEON:  Surgeon(s) and Role:    Boykin Nearing, MD - Primary  PHYSICIAN ASSISTANT: Miguel Dibble, PA student   ASSISTANTS: none   ANESTHESIA:   general  EBL:  Total I/O In: 400 [I.V.:400] Out: 100 [Urine:100]  BLOOD ADMINISTERED:none  DRAINS: none   LOCAL MEDICATIONS USED:  NONE  SPECIMEN:  Source of Specimen:  endocervical curetting, endometrial sample  DISPOSITION OF SPECIMEN:  PATHOLOGY  COUNTS:  YES  TOURNIQUET:  * No tourniquets in log *  DICTATION: .Other Dictation: Dictation Number verbal   PLAN OF CARE: Discharge to home after PACU  PATIENT DISPOSITION:  PACU - hemodynamically stable.   Delay start of Pharmacological VTE agent (>24hrs) due to surgical blood loss or risk of bleeding: not applicable

## 2016-07-13 ENCOUNTER — Inpatient Hospital Stay
Admission: RE | Admit: 2016-07-13 | Discharge: 2016-07-13 | Disposition: A | Discharge status: Home / Self Care | Payer: Self-pay | Source: Ambulatory Visit | Attending: *Deleted | Admitting: *Deleted

## 2016-07-13 ENCOUNTER — Other Ambulatory Visit: Payer: Self-pay | Admitting: *Deleted

## 2016-07-13 DIAGNOSIS — Z1231 Encounter for screening mammogram for malignant neoplasm of breast: Secondary | ICD-10-CM

## 2016-07-13 LAB — SURGICAL PATHOLOGY

## 2016-07-16 ENCOUNTER — Other Ambulatory Visit: Payer: Self-pay | Admitting: Obstetrics and Gynecology

## 2016-07-16 DIAGNOSIS — R928 Other abnormal and inconclusive findings on diagnostic imaging of breast: Secondary | ICD-10-CM

## 2016-07-16 DIAGNOSIS — N6310 Unspecified lump in the right breast, unspecified quadrant: Secondary | ICD-10-CM

## 2016-07-28 ENCOUNTER — Ambulatory Visit
Admission: RE | Admit: 2016-07-28 | Discharge: 2016-07-28 | Disposition: A | Discharge status: Home / Self Care | Payer: BLUE CROSS/BLUE SHIELD | Source: Ambulatory Visit | Attending: Obstetrics and Gynecology | Admitting: Obstetrics and Gynecology

## 2016-07-28 DIAGNOSIS — N6310 Unspecified lump in the right breast, unspecified quadrant: Secondary | ICD-10-CM

## 2016-07-28 DIAGNOSIS — R928 Other abnormal and inconclusive findings on diagnostic imaging of breast: Secondary | ICD-10-CM

## 2016-07-31 ENCOUNTER — Encounter: Payer: Self-pay | Admitting: Obstetrics and Gynecology

## 2016-07-31 NOTE — Addendum Note (Signed)
Addendum  created 07/31/16 0945 by Andria Frames, MD   Anesthesia Event edited

## 2016-08-17 ENCOUNTER — Encounter: Payer: Self-pay | Admitting: Emergency Medicine

## 2016-08-17 ENCOUNTER — Emergency Department: Payer: BLUE CROSS/BLUE SHIELD

## 2016-08-17 ENCOUNTER — Observation Stay
Admission: EM | Admit: 2016-08-17 | Discharge: 2016-08-18 | Disposition: A | Discharge status: Home / Self Care | Payer: BLUE CROSS/BLUE SHIELD | Attending: Internal Medicine | Admitting: Internal Medicine

## 2016-08-17 DIAGNOSIS — Z880 Allergy status to penicillin: Secondary | ICD-10-CM | POA: Insufficient documentation

## 2016-08-17 DIAGNOSIS — K047 Periapical abscess without sinus: Secondary | ICD-10-CM | POA: Insufficient documentation

## 2016-08-17 DIAGNOSIS — L03211 Cellulitis of face: Principal | ICD-10-CM | POA: Diagnosis present

## 2016-08-17 DIAGNOSIS — Z881 Allergy status to other antibiotic agents status: Secondary | ICD-10-CM | POA: Diagnosis not present

## 2016-08-17 DIAGNOSIS — R221 Localized swelling, mass and lump, neck: Secondary | ICD-10-CM | POA: Diagnosis present

## 2016-08-17 LAB — BASIC METABOLIC PANEL
Anion gap: 9 (ref 5–15)
BUN: 11 mg/dL (ref 6–20)
CALCIUM: 9.3 mg/dL (ref 8.9–10.3)
CHLORIDE: 105 mmol/L (ref 101–111)
CO2: 25 mmol/L (ref 22–32)
CREATININE: 0.52 mg/dL (ref 0.44–1.00)
GFR calc Af Amer: >60 mL/min (ref >60)
GFR calc non Af Amer: >60 mL/min (ref >60)
Glucose, Bld: 95 mg/dL (ref 65–99)
Potassium: 3.5 mmol/L (ref 3.5–5.1)
Sodium: 139 mmol/L (ref 135–145)

## 2016-08-17 LAB — CBC
HEMATOCRIT: 41.3 % (ref 35.0–47.0)
HEMOGLOBIN: 14 g/dL (ref 12.0–16.0)
MCH: 31.1 pg (ref 26.0–34.0)
MCHC: 33.8 g/dL (ref 32.0–36.0)
MCV: 92.1 fL (ref 80.0–100.0)
Platelets: 296 10*3/uL (ref 150–440)
RBC: 4.48 MIL/uL (ref 3.80–5.20)
RDW: 14.2 % (ref 11.5–14.5)
WBC: 14.4 10*3/uL — ABNORMAL HIGH (ref 3.6–11.0)

## 2016-08-17 MED ORDER — CLINDAMYCIN PHOSPHATE 300 MG/50ML IV SOLN
300.0000 mg | Freq: Four times a day (QID) | INTRAVENOUS | Status: DC
  Administered 2016-08-17 – 2016-08-18 (×4): 300 mg via INTRAVENOUS
  Filled 2016-08-17 (×6): qty 50

## 2016-08-17 MED ORDER — ONDANSETRON HCL 4 MG PO TABS: 4.0000 mg | ORAL_TABLET | Freq: Four times a day (QID) | ORAL | Status: DC | PRN

## 2016-08-17 MED ORDER — IOPAMIDOL (ISOVUE-300) INJECTION 61%
75.0000 mL | Freq: Once | INTRAVENOUS | Status: AC | PRN
  Administered 2016-08-17: 75 mL via INTRAVENOUS

## 2016-08-17 MED ORDER — DEXAMETHASONE SODIUM PHOSPHATE 10 MG/ML IJ SOLN
10.0000 mg | Freq: Once | INTRAMUSCULAR | Status: AC
  Administered 2016-08-17: 10 mg via INTRAVENOUS
  Filled 2016-08-17: qty 1

## 2016-08-17 MED ORDER — ACETAMINOPHEN 650 MG RE SUPP
650.0000 mg | Freq: Four times a day (QID) | RECTAL | Status: DC | PRN
Start: 2016-08-17 — End: 2016-08-18

## 2016-08-17 MED ORDER — CEFTRIAXONE SODIUM IN DEXTROSE 20 MG/ML IV SOLN
1.0000 g | Freq: Once | INTRAVENOUS | Status: AC
  Administered 2016-08-17: 1 g via INTRAVENOUS
  Filled 2016-08-17: qty 50

## 2016-08-17 MED ORDER — ONDANSETRON HCL 4 MG/2ML IJ SOLN: 4.0000 mg | Freq: Four times a day (QID) | INTRAMUSCULAR | Status: DC | PRN

## 2016-08-17 MED ORDER — ACETAMINOPHEN 325 MG PO TABS: 650.0000 mg | ORAL_TABLET | Freq: Four times a day (QID) | ORAL | Status: DC | PRN

## 2016-08-17 MED ORDER — DEXAMETHASONE SODIUM PHOSPHATE 10 MG/ML IJ SOLN
10.0000 mg | Freq: Four times a day (QID) | INTRAMUSCULAR | Status: DC
  Administered 2016-08-17 – 2016-08-18 (×4): 10 mg via INTRAVENOUS
  Filled 2016-08-17 (×4): qty 1

## 2016-08-17 MED ORDER — VANCOMYCIN HCL IN DEXTROSE 1-5 GM/200ML-% IV SOLN
1000.0000 mg | Freq: Once | INTRAVENOUS | Status: AC
  Administered 2016-08-17: 1000 mg via INTRAVENOUS
  Filled 2016-08-17: qty 200

## 2016-08-17 NOTE — ED Provider Notes (Signed)
[facility] Department Provider Note   First MD Initiated Contact with Patient 08/17/16 1145     (approximate)  I have reviewed the triage vital signs and the nursing notes.   HISTORY  Chief Complaint Facial Swelling    HPI JILLAYNE WITTE is a 61 y.o. female with below list of current medical condition presents to the emergency department with acute onset of right facial/neck swelling tenderness since last night. Patient stated when she went to bed last night there was a small area of swelling noted to the right side of her face Route on awakening this morning patient states that her face is markedly swollen which indeed it is extending down to the right submandibular portion of the neck. Patient denies any dyspnea. Patient denies any difficulty swallowing. Patient does admit to having a cavity in the area on the right mandible. However she denies any pain in that area. Patient denies any fever   Past Medical History:  Diagnosis Date  . Heart murmur    ASYMPTOMATIC    Patient Active Problem List   Diagnosis Date Noted  . Facial cellulitis 08/17/2016    Past Surgical History:  Procedure Laterality Date  . BUNIONECTOMY Right   . HYSTEROSCOPY W/D&C N/A 07/09/2016   Procedure: DILATATION AND CURETTAGE /HYSTEROSCOPY;  Surgeon: Boykin Nearing, MD;  Location: [facility] ORS;  Service: Gynecology;  Laterality: N/A;  . KNEE SURGERY    . uterine ablation      Prior to Admission medications   Not on File    Allergies Augmentin [amoxicillin-pot clavulanate]  Family History  Problem Relation Age of Onset  . Cancer Father   . Breast cancer Neg Hx     Social History Social History  Substance Use Topics  . Smoking status: Never Smoker  . Smokeless tobacco: Never Used  . Alcohol use No    Review of Systems Constitutional: No fever/chills Eyes: No visual changes. ENT: No sore throat.Positive for right face and neck  swelling Cardiovascular: Denies chest pain. Respiratory: Denies shortness of breath. Gastrointestinal: No abdominal pain.  No nausea, no vomiting.  No diarrhea.  No constipation. Genitourinary: Negative for dysuria. Musculoskeletal: Negative for back pain. Integumentary: Negative for rash. Neurological: Negative for headaches, focal weakness or numbness.   ____________________________________________   PHYSICAL EXAM:  VITAL SIGNS: ED Triage Vitals  Enc Vitals Group     BP 08/17/16 1105 (!) 151/54     Pulse Rate 08/17/16 1105 63     Resp 08/17/16 1105 16     Temp 08/17/16 1105 98.8 F (37.1 C)     Temp Source 08/17/16 1105 Oral     SpO2 08/17/16 1105 97 %     Weight 08/17/16 1013 159 lb (72.1 kg)     Height 08/17/16 1105 5\' 2"  (1.575 m)     Head Circumference --      Peak Flow --      Pain Score 08/17/16 1013 5     Pain Loc --      Pain Edu? --      Excl. in [place]? --     Constitutional: Alert and oriented. Well appearing and in no acute distress. Eyes: Conjunctivae are normal. PERRL. EOMI. Head:Right facial swelling. The parotid gland extending down to the anterior lateral neck Ears:  Healthy appearing ear canals and TMs bilaterally Nose: No congestion/rhinnorhea. Mouth/Throat: Mucous membranes are moist.  Oropharynx non-erythematous. Neck: No stridor. Cardiovascular: Normal rate, regular rhythm. Good peripheral circulation. Grossly  normal heart sounds. Respiratory: Normal respiratory effort.  No retractions. Lungs CTAB. Gastrointestinal: Soft and nontender. No distention.  Musculoskeletal: No lower extremity tenderness nor edema. No gross deformities of extremities. Neurologic:  Normal speech and language. No gross focal neurologic deficits are appreciated.  Skin:  Skin is warm, dry and intact. No rash noted. Psychiatric: Mood and affect are normal. Speech and behavior are normal.  ____________________________________________   LABS (all labs ordered are listed, but  only abnormal results are displayed)  Labs Reviewed  CBC - Abnormal; Notable for the following:       Result Value   WBC 14.4 (*)    All other components within normal limits  BASIC METABOLIC PANEL   __________________________________________________  RADIOLOGY I, Tetonia Ernst Bowler, personally viewed and evaluated these images (plain radiographs) as part of my medical decision making, as well as reviewing the written report by the radiologist.  Ct Soft Tissue Neck W Contrast  Result Date: 08/17/2016 CLINICAL DATA:  Right facial and neck swelling. EXAM: CT NECK WITH CONTRAST TECHNIQUE: Multidetector CT imaging of the neck was performed using the standard protocol following the bolus administration of intravenous contrast. CONTRAST:  36mL ISOVUE-300 IOPAMIDOL (ISOVUE-300) INJECTION 61% COMPARISON:  None. FINDINGS: Fat edema in the right face, tracking deep to the level of the buccinator and inferiorly along the submandibular space. No floor of mouth involvement or evidence of abscess. Pattern is nonspecific, but often odontogenic. The 32nd tooth is horizontal and has a periapical erosion. Pharynx and larynx: Normal. No mass or swelling. Salivary glands: No primary inflammation is noted. Negative for stone or mass. Thyroid: Normal Lymph nodes: Mild reactive appearing enlargement of right submental and IJ chain lymph nodes. No cavitation. Vascular: Mild atherosclerotic calcifications of the carotid bifurcations. Venous structures are patent. Limited intracranial: Negative Visualized orbits: Chronic appearing deformity in the medial wall right orbit. Mastoids and visualized paranasal sinuses: Clear Skeleton: No acute or aggressive findings in the right mandible. C5-6 predominant disc degeneration. Upper chest: Negative IMPRESSION: Edema/ infection in the deep right face, possibly odontogenic due to a 32nd tooth periapical erosion. Negative for abscess or airway narrowing. Electronically Signed   By:  Monte Fantasia M.D.   On: 08/17/2016 13:03    ___ Procedures   ____________________________________________   INITIAL IMPRESSION / ASSESSMENT AND PLAN / ED COURSE  Pertinent labs & imaging results that were available during my care of the patient were reviewed by me and considered in my medical decision making (see chart for details).  61 year old female presents to the emergency department with markedly swollen right side of the face extending to the submandibular portion of the right neck. CT scan revealed evidence of a possible deep right face infection. Patient given IV vancomycin and ceftriaxone in the emergency department. Patient discussed with Dr. Verdell Carmine for hospital admission further evaluation and management.      ____________________________________________  FINAL CLINICAL IMPRESSION(S) / ED DIAGNOSES  Final diagnoses:  Facial cellulitis     MEDICATIONS GIVEN DURING THIS VISIT:  Medications  vancomycin (VANCOCIN) IVPB 1000 mg/200 mL premix (not administered)  cefTRIAXone (ROCEPHIN) 1 g in dextrose 5 % 50 mL IVPB - Premix (1 g Intravenous New Bag/Given 08/17/16 1422)  dexamethasone (DECADRON) injection 10 mg (10 mg Intravenous Given 08/17/16 1210)  iopamidol (ISOVUE-300) 61 % injection 75 mL (75 mLs Intravenous Contrast Given 08/17/16 1240)     NEW OUTPATIENT MEDICATIONS STARTED DURING THIS VISIT:  New Prescriptions   No medications on file    Modified  Medications   No medications on file    Discontinued Medications   No medications on file     Note:  This document was prepared using Dragon voice recognition software and may include unintentional dictation errors.    Gregor Hams, MD 08/17/16 [phone]

## 2016-08-17 NOTE — ED Notes (Signed)
Patient states right side of face doesn't feel as taut at this time. :Patient appears calm, comfortable. NAD. Husband at bedside.

## 2016-08-17 NOTE — ED Notes (Signed)
Attempted IV access x 2, notified primary RN Tana Conch unable to get IV

## 2016-08-17 NOTE — ED Notes (Signed)
Patient left for CT scan. 

## 2016-08-17 NOTE — ED Triage Notes (Signed)
Pt to ed with c/o right sided facial swelling.  Pt states when she went to bed last night it was mild swelling but this am had severe swelling.  Pt alert and oriented, denies difficulty swallowing, denies difficulty with breathing at this time.  Denies dental pain.

## 2016-08-17 NOTE — H&P (Signed)
[facility] [place] NAME: Michele Bishop    MR#:  [id number]  DATE OF BIRTH:  Nov 25, 1955  DATE OF ADMISSION:  08/17/2016  PRIMARY CARE PHYSICIAN: Wayland Denis, PA-C   REQUESTING/REFERRING PHYSICIAN: Dr. Oval Linsey brown  CHIEF COMPLAINT:   Chief Complaint  Patient presents with  . Facial Swelling    HISTORY OF PRESENT ILLNESS:  Michele Bishop  is a 61 y.o. female with No significant past medical history presents to the hospital due to right facial swelling. Patient says she was in her usual state of health and woke up this morning and her right cheek/patient was very swollen. She denies any fevers, chills, nausea, vomiting. She denies any recent dental work or any toothache. She presented to the ER and underwent a CT scan of her neck which was suggestive of cellulitis secondary to a infected tooth. Hospitalist services were contacted for further treatment and evaluation.  PAST MEDICAL HISTORY:   Past Medical History:  Diagnosis Date  . Heart murmur    ASYMPTOMATIC    PAST SURGICAL HISTORY:   Past Surgical History:  Procedure Laterality Date  . BUNIONECTOMY Right   . HYSTEROSCOPY W/D&C N/A 07/09/2016   Procedure: DILATATION AND CURETTAGE /HYSTEROSCOPY;  Surgeon: Boykin Nearing, MD;  Location: [facility] ORS;  Service: Gynecology;  Laterality: N/A;  . KNEE SURGERY    . uterine ablation      SOCIAL HISTORY:   Social History  Substance Use Topics  . Smoking status: Never Smoker  . Smokeless tobacco: Never Used  . Alcohol use No    FAMILY HISTORY:   Family History  Problem Relation Age of Onset  . Cancer Father   . Breast cancer Neg Hx     DRUG ALLERGIES:   Allergies  Allergen Reactions  . Augmentin [Amoxicillin-Pot Clavulanate] Hives    REVIEW OF SYSTEMS:   Review of Systems  Constitutional: Negative for fever and weight loss.  HENT: Negative for congestion, nosebleeds and tinnitus.   Eyes: Negative  for blurred vision, double vision and redness.  Respiratory: Negative for cough, hemoptysis and shortness of breath.   Cardiovascular: Negative for chest pain, orthopnea, leg swelling and PND.  Gastrointestinal: Negative for abdominal pain, diarrhea, melena, nausea and vomiting.  Genitourinary: Negative for dysuria, hematuria and urgency.  Musculoskeletal: Negative for falls and joint pain.  Neurological: Negative for dizziness, tingling, sensory change, focal weakness, seizures, weakness and headaches.  Endo/Heme/Allergies: Negative for polydipsia. Does not bruise/bleed easily.  Psychiatric/Behavioral: Negative for depression and memory loss. The patient is not nervous/anxious.     MEDICATIONS AT HOME:   Prior to Admission medications   Not on File      VITAL SIGNS:  Blood pressure (!) 142/59, pulse 64, temperature 98.8 F (37.1 C), temperature source Oral, resp. rate 16, height 5\' 2"  (1.575 m), weight 72.6 kg (160 lb), SpO2 100 %.  PHYSICAL EXAMINATION:  Physical Exam  GENERAL:  61 y.o.-year-old patient lying in the bed with no acute distress.  EYES: Pupils equal, round, reactive to light and accommodation. No scleral icterus. Extraocular muscles intact.  HEENT: Head atraumatic, normocephalic. Oropharynx and nasopharynx clear. No oropharyngeal erythema, moist oral mucosa  NECK:  Supple, no jugular venous distention. No thyroid enlargement, no tenderness.  LUNGS: Normal breath sounds bilaterally, no wheezing, rales, rhonchi. No use of accessory muscles of respiration.  CARDIOVASCULAR: S1, S2 RRR. No murmurs, rubs, gallops, clicks.  ABDOMEN: Soft, nontender, nondistended. Bowel sounds present. No organomegaly or mass.  EXTREMITIES: No pedal edema, cyanosis, or clubbing. + 2 pedal & radial pulses b/l.   NEUROLOGIC: Cranial nerves II through XII are intact. No focal Motor or sensory deficits appreciated b/l PSYCHIATRIC: The patient is alert and oriented x 3. Good affect.  SKIN: No  obvious rash, lesion, or ulcer. Right facial swelling consistent with cellulitis.   LABORATORY PANEL:   CBC  Recent Labs Lab 08/17/16 1102  WBC 14.4*  HGB 14.0  HCT 41.3  PLT 296   ------------------------------------------------------------------------------------------------------------------  Chemistries   Recent Labs Lab 08/17/16 1102  NA 139  K 3.5  CL 105  CO2 25  GLUCOSE 95  BUN 11  CREATININE 0.52  CALCIUM 9.3   ------------------------------------------------------------------------------------------------------------------  Cardiac Enzymes No results for input(s): TROPONINI in the last 168 hours. ------------------------------------------------------------------------------------------------------------------  RADIOLOGY:  Ct Soft Tissue Neck W Contrast  Result Date: 08/17/2016 CLINICAL DATA:  Right facial and neck swelling. EXAM: CT NECK WITH CONTRAST TECHNIQUE: Multidetector CT imaging of the neck was performed using the standard protocol following the bolus administration of intravenous contrast. CONTRAST:  24mL ISOVUE-300 IOPAMIDOL (ISOVUE-300) INJECTION 61% COMPARISON:  None. FINDINGS: Fat edema in the right face, tracking deep to the level of the buccinator and inferiorly along the submandibular space. No floor of mouth involvement or evidence of abscess. Pattern is nonspecific, but often odontogenic. The 32nd tooth is horizontal and has a periapical erosion. Pharynx and larynx: Normal. No mass or swelling. Salivary glands: No primary inflammation is noted. Negative for stone or mass. Thyroid: Normal Lymph nodes: Mild reactive appearing enlargement of right submental and IJ chain lymph nodes. No cavitation. Vascular: Mild atherosclerotic calcifications of the carotid bifurcations. Venous structures are patent. Limited intracranial: Negative Visualized orbits: Chronic appearing deformity in the medial wall right orbit. Mastoids and visualized paranasal sinuses:  Clear Skeleton: No acute or aggressive findings in the right mandible. C5-6 predominant disc degeneration. Upper chest: Negative IMPRESSION: Edema/ infection in the deep right face, possibly odontogenic due to a 32nd tooth periapical erosion. Negative for abscess or airway narrowing. Electronically Signed   By: Monte Fantasia M.D.   On: 08/17/2016 13:03     IMPRESSION AND PLAN:   61 year old female with no significant past medical history presents to the hospital due to right facial swelling.  1. Right facial cellulitis-is the cause of patient's swelling. The source is likely an infected tooth. She denies any toothache or any recent dental work. -We'll treat the patient with IV clindamycin, IV Decadron. If not improving consider ENT consult. -Patient will need outpatient dental appointment to get her tooth removed.  2. Leukocytosis - due to # 1.  - follow w/ IV abx therapy.     All the records are reviewed and case discussed with ED provider. Management plans discussed with the patient, family and they are in agreement.  CODE STATUS: full code  TOTAL TIME TAKING CARE OF THIS PATIENT: 40 minutes.    Henreitta Leber M.D on 08/17/2016 at 2:34 PM  Between 7am to 6pm - Pager - [phone]  After 6pm go to [email or web address] - password [email or web address] [facility]  Office  [phone]  CC: Primary care physician; Wayland Denis, PA-C

## 2016-08-18 LAB — CBC
HCT: 37.5 % (ref 35.0–47.0)
HEMOGLOBIN: 12.7 g/dL (ref 12.0–16.0)
MCH: 30.6 pg (ref 26.0–34.0)
MCHC: 34 g/dL (ref 32.0–36.0)
MCV: 90.1 fL (ref 80.0–100.0)
Platelets: 313 10*3/uL (ref 150–440)
RBC: 4.16 MIL/uL (ref 3.80–5.20)
RDW: 14.2 % (ref 11.5–14.5)
WBC: 13.6 10*3/uL — ABNORMAL HIGH (ref 3.6–11.0)

## 2016-08-18 MED ORDER — CLINDAMYCIN HCL 300 MG PO CAPS: 300.0000 mg | ORAL_CAPSULE | Freq: Three times a day (TID) | ORAL | 0 refills | Status: AC

## 2016-08-18 MED ORDER — PREDNISONE 5 MG PO TABS: ORAL_TABLET | ORAL | 0 refills | Status: AC

## 2016-08-18 NOTE — Progress Notes (Signed)
08/18/2016 12:59 PM  Michele Bishop to be D/C'd Home per MD order.  Discussed prescriptions and follow up appointments with the patient. Prescriptions given to patient, medication list explained in detail. Pt verbalized understanding.  Allergies as of 08/18/2016      Reactions   Augmentin [amoxicillin-pot Clavulanate] Hives      Medication List    TAKE these medications   clindamycin 300 MG capsule Commonly known as:  CLEOCIN Take 1 capsule (300 mg total) by mouth 3 (three) times daily.   predniSONE 5 MG tablet Commonly known as:  DELTASONE 4 tabs po day1; 3 tabs po day2; 2 tabs po day3; 1 tab po day4,5       Vitals:   08/17/16 2155 08/18/16 0532  BP: (!) 121/47 (!) 123/52  Pulse: 80 70  Resp: 18 18  Temp: 98 F (36.7 C) 97.7 F (36.5 C)    Skin clean, dry and intact without evidence of skin break down, no evidence of skin tears noted. IV catheter discontinued intact. Site without signs and symptoms of complications. Dressing and pressure applied. Pt denies pain at this time. No complaints noted.  An After Visit Summary was printed and given to the patient. Patient escorted via [place], and D/C home via private auto.  Dola Argyle

## 2016-08-18 NOTE — Care Management Obs Status (Signed)
[facility] NOTIFICATION   Patient Details  Name: RICHETTA CUBILLOS MRN: [id number] Date of Birth: [birth year]   Medicare Observation Status Notification Given:  No (admitted obs less than 24 hours)    Beverly Sessions, RN 08/18/2016, 9:49 AM

## 2016-08-18 NOTE — Discharge Summary (Signed)
[facility] [place] NAME: Brandalynn Ofallon    MR#:  [id number]  DATE OF BIRTH:  Nov 11, 1955  DATE OF ADMISSION:  08/17/2016 ADMITTING PHYSICIAN: Henreitta Leber, MD  DATE OF DISCHARGE: 08/18/2016  2:46 PM  PRIMARY CARE PHYSICIAN: Wayland Denis, PA-C    ADMISSION DIAGNOSIS:  Facial cellulitis [O53.664]  DISCHARGE DIAGNOSIS:  Active Problems:   Facial cellulitis   SECONDARY DIAGNOSIS:   Past Medical History:  Diagnosis Date  . Heart murmur    ASYMPTOMATIC    HOSPITAL COURSE:   1. Dental abscess, facial cellulitis and leukocytosis. Patient was started on IV clindamycin. Swelling is still present on the right cheek but much improved as per patient. No erythema seen on the face on the day of discharge. I recommended her following up with her dentist and memory care physician as soon as possible. Clindamycin prescribed for total 10 days.  DISCHARGE CONDITIONS:   Satisfactory  CONSULTS OBTAINED:   none  DRUG ALLERGIES:   Allergies  Allergen Reactions  . Augmentin [Amoxicillin-Pot Clavulanate] Hives    DISCHARGE MEDICATIONS:   Discharge Medication List as of 08/18/2016 11:13 AM    START taking these medications   Details  clindamycin (CLEOCIN) 300 MG capsule Take 1 capsule (300 mg total) by mouth 3 (three) times daily., Starting Wed 08/18/2016, Print    predniSONE (DELTASONE) 5 MG tablet 4 tabs po day1; 3 tabs po day2; 2 tabs po day3; 1 tab po day4,5, Print         DISCHARGE INSTRUCTIONS:   Follow-up PMD one week Follow-up her dentist 1 week   If you experience worsening of your admission symptoms, develop shortness of breath, life threatening emergency, suicidal or homicidal thoughts you must seek medical attention immediately by calling 911 or calling your MD immediately  if symptoms less severe.  You Must read complete instructions/literature along with all the possible adverse reactions/side effects for all the  Medicines you take and that have been prescribed to you. Take any new Medicines after you have completely understood and accept all the possible adverse reactions/side effects.   Please note  You were cared for by a hospitalist during your hospital stay. If you have any questions about your discharge medications or the care you received while you were in the hospital after you are discharged, you can call the unit and asked to speak with the hospitalist on call if the hospitalist that took care of you is not available. Once you are discharged, your primary care physician will handle any further medical issues. Please note that NO REFILLS for any discharge medications will be authorized once you are discharged, as it is imperative that you return to your primary care physician (or establish a relationship with a primary care physician if you do not have one) for your aftercare needs so that they can reassess your need for medications and monitor your lab values.    Today   CHIEF COMPLAINT:   Chief Complaint  Patient presents with  . Facial Swelling    HISTORY OF PRESENT ILLNESS:  Anjela Cassara  is a 61 y.o. female presented with facial swelling   VITAL SIGNS:  Blood pressure (!) 123/52, pulse 70, temperature 97.7 F (36.5 C), temperature source Oral, resp. rate 18, height 5\' 2"  (1.575 m), weight 72.6 kg (160 lb), SpO2 98 %.    PHYSICAL EXAMINATION:  GENERAL:  61 y.o.-year-old patient lying in the bed with no acute distress.  EYES:  Pupils equal, round, reactive to light and accommodation. No scleral icterus. Extraocular muscles intact.  HEENT: Head atraumatic, normocephalic. Poor dentition. Swelling right cheek.  NECK:  Supple, no jugular venous distention. No thyroid enlargement, no tenderness.  LUNGS: Normal breath sounds bilaterally, no wheezing, rales,rhonchi or crepitation. No use of accessory muscles of respiration.  CARDIOVASCULAR: S1, S2 normal. No murmurs, rubs, or  gallops.  ABDOMEN: Soft, non-tender, non-distended. Bowel sounds present. No organomegaly or mass.  EXTREMITIES: No pedal edema, cyanosis, or clubbing.  NEUROLOGIC: Cranial nerves II through XII are intact. Muscle strength 5/5 in all extremities. Sensation intact. Gait not checked.  PSYCHIATRIC: The patient is alert and oriented x 3.  SKIN: No erythema seen on the face  DATA REVIEW:   CBC  Recent Labs Lab 08/18/16 0520  WBC 13.6*  HGB 12.7  HCT 37.5  PLT 313    Chemistries   Recent Labs Lab 08/17/16 1102  NA 139  K 3.5  CL 105  CO2 25  GLUCOSE 95  BUN 11  CREATININE 0.52  CALCIUM 9.3      RADIOLOGY:  Ct Soft Tissue Neck W Contrast  Result Date: 08/17/2016 CLINICAL DATA:  Right facial and neck swelling. EXAM: CT NECK WITH CONTRAST TECHNIQUE: Multidetector CT imaging of the neck was performed using the standard protocol following the bolus administration of intravenous contrast. CONTRAST:  73mL ISOVUE-300 IOPAMIDOL (ISOVUE-300) INJECTION 61% COMPARISON:  None. FINDINGS: Fat edema in the right face, tracking deep to the level of the buccinator and inferiorly along the submandibular space. No floor of mouth involvement or evidence of abscess. Pattern is nonspecific, but often odontogenic. The 32nd tooth is horizontal and has a periapical erosion. Pharynx and larynx: Normal. No mass or swelling. Salivary glands: No primary inflammation is noted. Negative for stone or mass. Thyroid: Normal Lymph nodes: Mild reactive appearing enlargement of right submental and IJ chain lymph nodes. No cavitation. Vascular: Mild atherosclerotic calcifications of the carotid bifurcations. Venous structures are patent. Limited intracranial: Negative Visualized orbits: Chronic appearing deformity in the medial wall right orbit. Mastoids and visualized paranasal sinuses: Clear Skeleton: No acute or aggressive findings in the right mandible. C5-6 predominant disc degeneration. Upper chest: Negative  IMPRESSION: Edema/ infection in the deep right face, possibly odontogenic due to a 32nd tooth periapical erosion. Negative for abscess or airway narrowing. Electronically Signed   By: Monte Fantasia M.D.   On: 08/17/2016 13:03       Management plans discussed with the patient, and she is  in agreement.  CODE STATUS:     Code Status Orders        Start     Ordered   08/17/16 1831  Full code  Continuous     08/17/16 1830    Code Status History    Date Active Date Inactive Code Status Order ID Comments User Context   This patient has a current code status but no historical code status.    Advance Directive Documentation     Most Recent Value  Type of Advance Directive  Healthcare Power of Attorney  Pre-existing out of facility DNR order (yellow form or pink MOST form)  -  "MOST" Form in Place?  -      TOTAL TIME TAKING CARE OF THIS PATIENT: 32 minutes.    Loletha Grayer M.D on 08/18/2016 at 5:17 PM  Between 7am to 6pm - Pager - [phone]  After 6pm go to [email or web address] - Proofreader  [facility] Office  [phone]  CC: Primary care physician; Wayland Denis, PA-C

## 2018-06-21 IMAGING — US US BREAST*R* LIMITED INC AXILLA
1 series · 7 of 7 positions shown · non-contrast
Comparison: Previous exam(s).

CLINICAL DATA: Callback from screening mammogram for possible
enlarged lymph node. The patient reports that she recently was
treated for shingles under her right breast in the past month which
has now resolved.

EXAM:
2D DIGITAL DIAGNOSTIC RIGHT MAMMOGRAM WITH CAD AND ADJUNCT TOMO
ULTRASOUND RIGHT BREAST

[Series 1: us breast*right* limited inc axilla · 0.07mm/px · 7 of 7 slices shown]
[im 1/7]
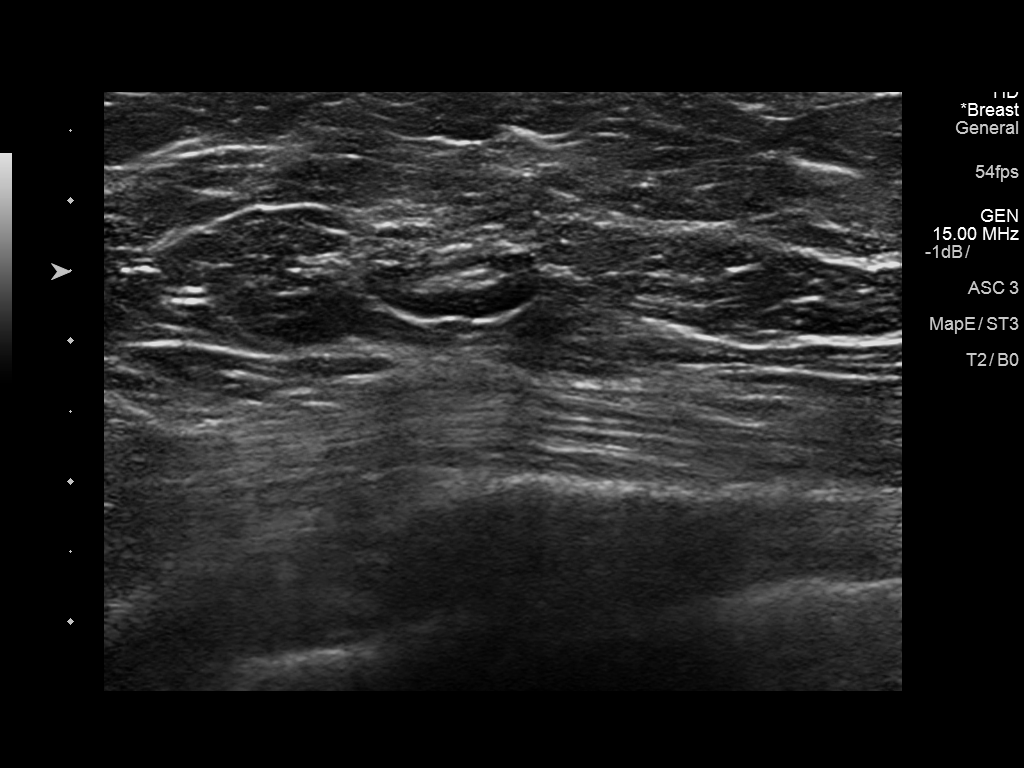
[im 2/7]
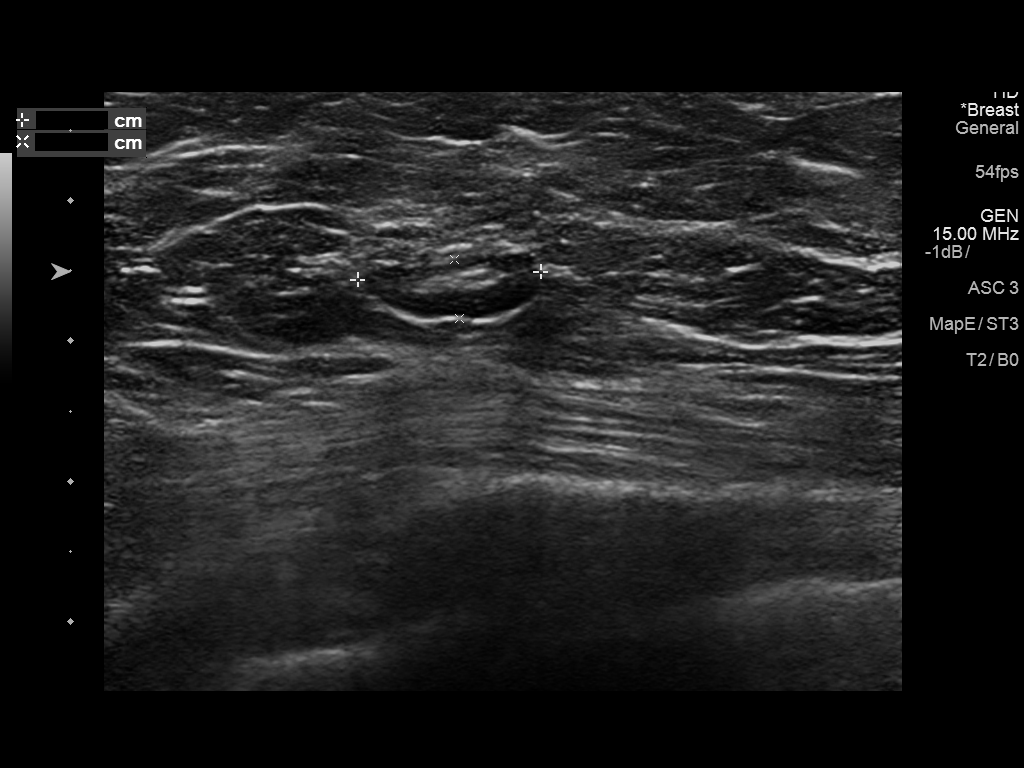
[im 3/7]
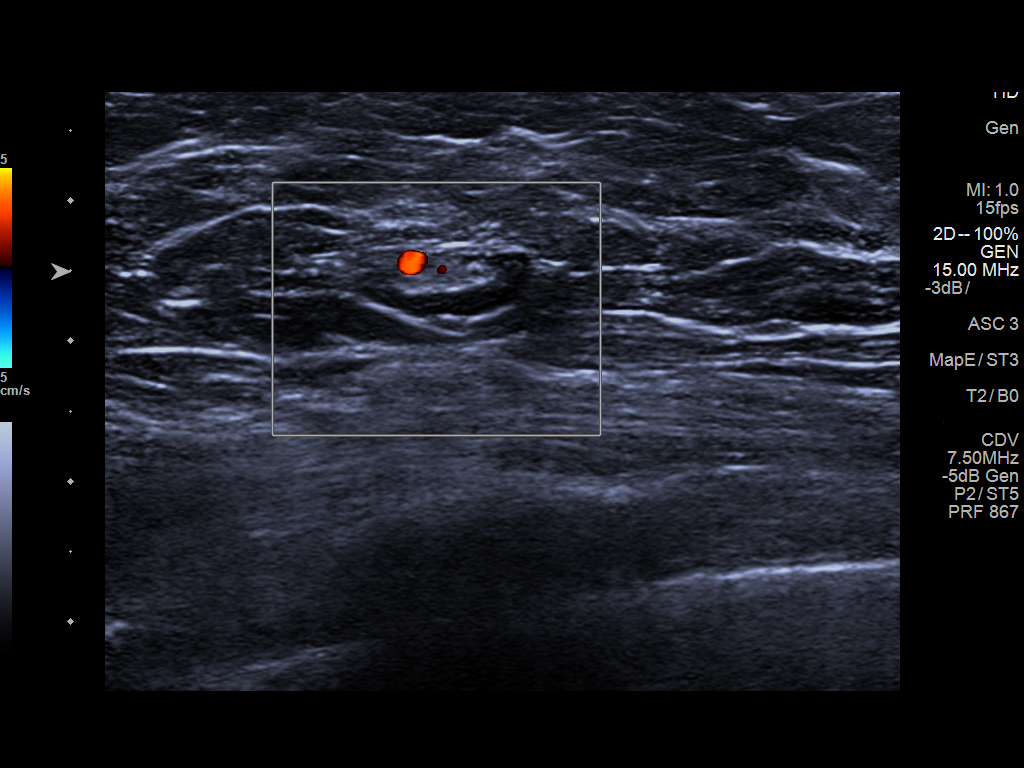
[im 4/7]
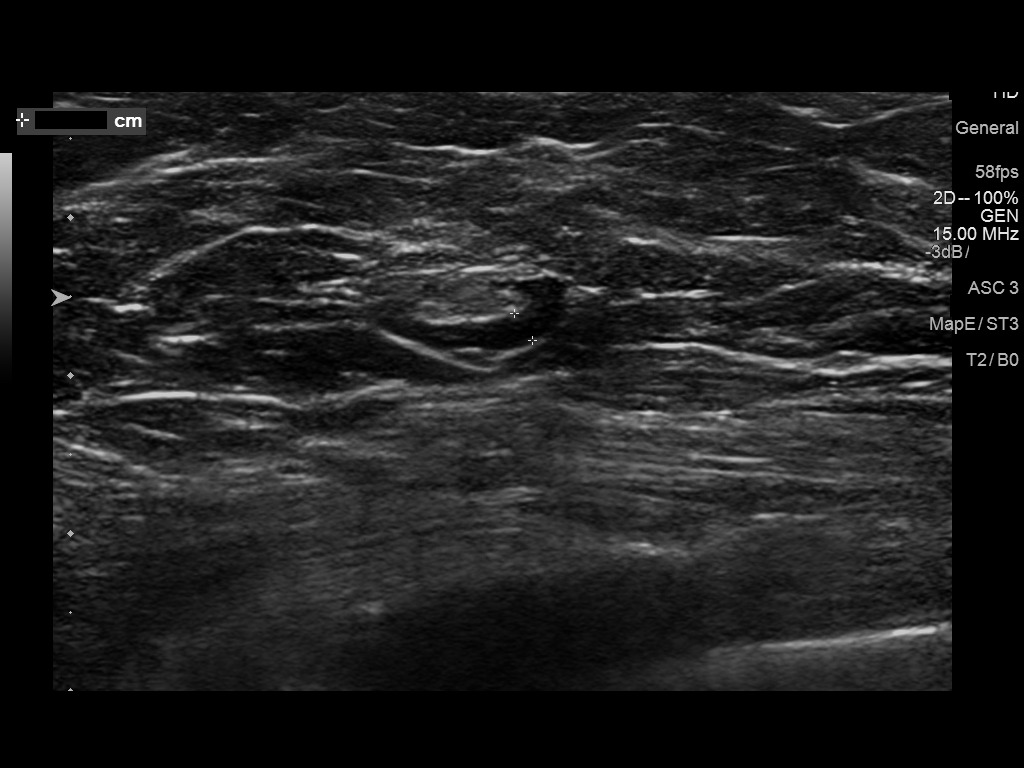
[im 5/7]
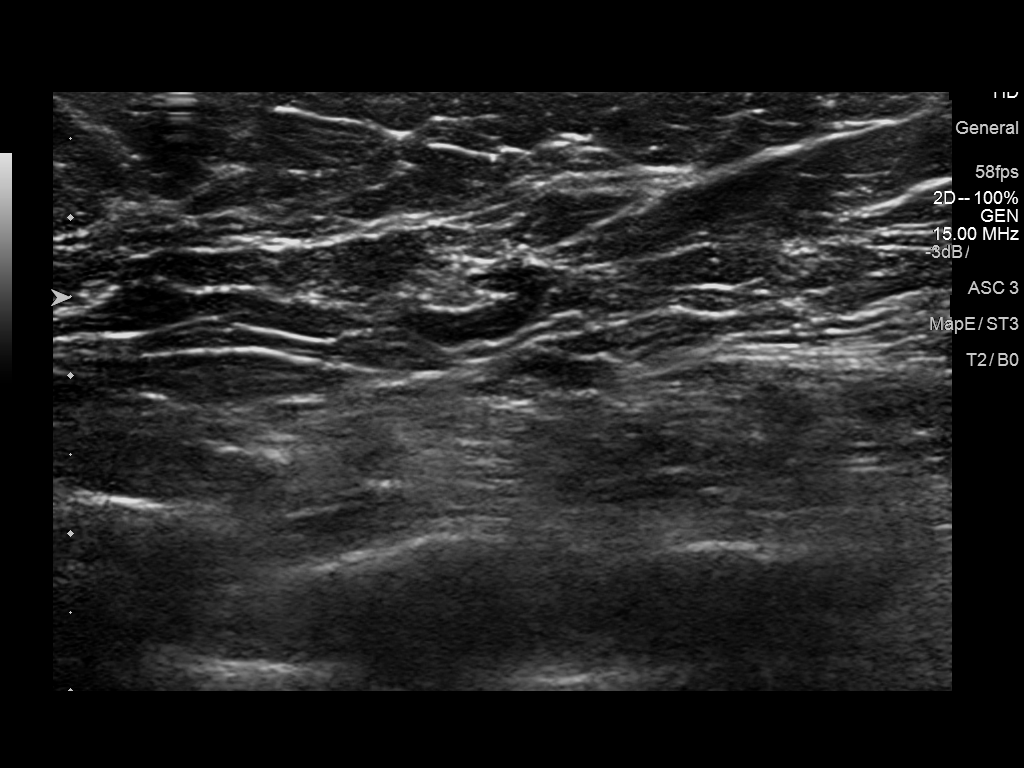
[im 6/7]
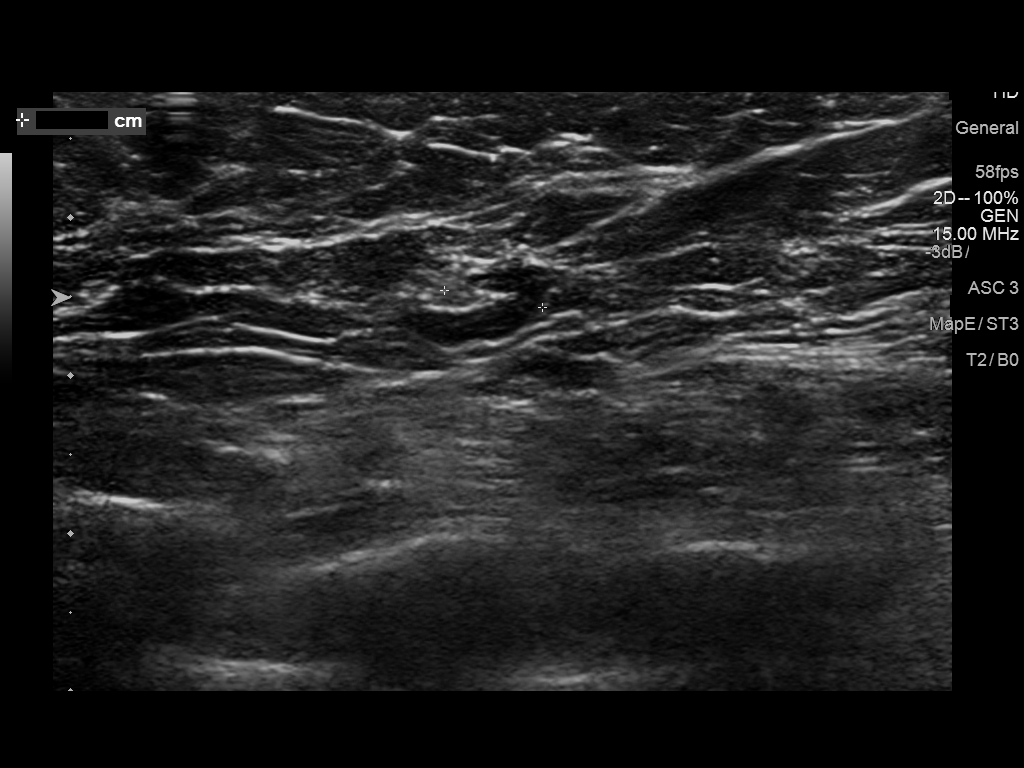
[im 7/7]
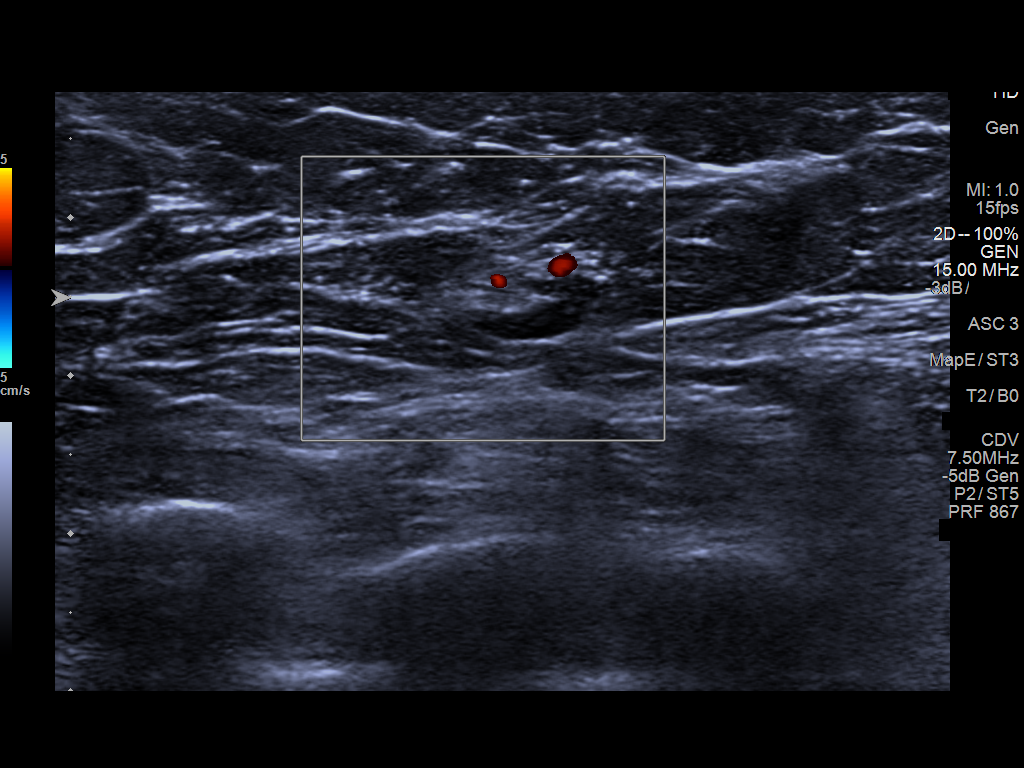

[7 of 7 positions shown; findings below may reference images not displayed]

ACR Breast Density Category c: The breast tissue is heterogeneously
dense, which may obscure small masses.
FINDINGS: On the additional views there is an approximately 1.4 cm prominent
intramammary lymph node, only seen in the posterior, superior right
breast. The node appears slightly less prominent than on recent
screening mammogram dated 07/06/2016.

Mammographic images were processed with CAD.

Targeted ultrasound of the lateral right breast demonstrates a
normal appearing intramammary lymph node at 9 o'clock, 10 cm from
the nipple measuring 13 mm in maximal size with a cortical thickness
of 2 mm. This lymph node is thought to likely correspond to the
lymph node seen mammographically.
IMPRESSION: Likely reactive intramammary lymph node related to patient's recent
shingles. Given the lymph node's prominent appearance
mammographically, short-term follow-up is recommended.

RECOMMENDATION:
Right diagnostic mammogram and possible ultrasound in 6 months.

I have discussed the findings and recommendations with the patient.
Results were also provided in writing at the conclusion of the
visit. If applicable, a reminder letter will be sent to the patient
regarding the next appointment.

BI-RADS CATEGORY  3: Probably benign.

## 2019-05-20 ENCOUNTER — Ambulatory Visit: Payer: Medicare HMO | Attending: Internal Medicine

## 2019-05-20 ENCOUNTER — Ambulatory Visit: Payer: No Typology Code available for payment source

## 2019-05-20 DIAGNOSIS — Z23 Encounter for immunization: Secondary | ICD-10-CM | POA: Insufficient documentation

## 2019-05-20 NOTE — Progress Notes (Signed)
   Covid-19 Vaccination Clinic  Name:  DANAKA GOTCHER    MRN: [id number] DOB: [birth year]  05/20/2019  Ms. Bais was observed post Covid-19 immunization for 15 minutes without incidence. She was provided with Vaccine Information Sheet and instruction to access the V-Safe system.   Ms. Bloodsaw was instructed to call 911 with any severe reactions post vaccine: Marland Kitchen Difficulty breathing  . Swelling of your face and throat  . A fast heartbeat  . A bad rash all over your body  . Dizziness and weakness    Immunizations Administered    Name Date Dose VIS Date Route   Pfizer COVID-19 Vaccine 05/20/2019  8:33 AM 0.3 mL 03/16/2019 Intramuscular   Manufacturer: [place]   Lot: [id number]   [place]: [id number]

## 2019-06-13 ENCOUNTER — Ambulatory Visit: Payer: Medicare HMO | Attending: Internal Medicine

## 2019-06-13 DIAGNOSIS — Z23 Encounter for immunization: Secondary | ICD-10-CM

## 2019-06-13 NOTE — Progress Notes (Signed)
   Covid-19 Vaccination Clinic  Name:  Michele Bishop    MRN: [id number] DOB: Feb 21, 1956  06/13/2019  Michele Bishop was observed post Covid-19 immunization for 15 minutes without incident. She was provided with Vaccine Information Sheet and instruction to access the V-Safe system.   Michele Bishop was instructed to call 911 with any severe reactions post vaccine: Marland Kitchen Difficulty breathing  . Swelling of face and throat  . A fast heartbeat  . A bad rash all over body  . Dizziness and weakness   Immunizations Administered    Name Date Dose VIS Date Route   Pfizer COVID-19 Vaccine 06/13/2019 12:51 PM 0.3 mL 03/16/2019 Intramuscular   Manufacturer: [place]   Lot: [id number]   [place]: [id number]

## 2021-01-07 ENCOUNTER — Ambulatory Visit: Payer: Medicare HMO | Admitting: Dermatology

## 2021-01-07 ENCOUNTER — Other Ambulatory Visit: Payer: Self-pay

## 2021-01-07 ENCOUNTER — Encounter: Payer: Self-pay | Admitting: Dermatology

## 2021-01-07 DIAGNOSIS — L578 Other skin changes due to chronic exposure to nonionizing radiation: Secondary | ICD-10-CM

## 2021-01-07 DIAGNOSIS — D229 Melanocytic nevi, unspecified: Secondary | ICD-10-CM | POA: Diagnosis not present

## 2021-01-07 DIAGNOSIS — L820 Inflamed seborrheic keratosis: Secondary | ICD-10-CM

## 2021-01-07 DIAGNOSIS — Z1283 Encounter for screening for malignant neoplasm of skin: Secondary | ICD-10-CM | POA: Diagnosis not present

## 2021-01-07 DIAGNOSIS — D1800 Hemangioma unspecified site: Secondary | ICD-10-CM

## 2021-01-07 DIAGNOSIS — L918 Other hypertrophic disorders of the skin: Secondary | ICD-10-CM | POA: Diagnosis not present

## 2021-01-07 DIAGNOSIS — L814 Other melanin hyperpigmentation: Secondary | ICD-10-CM

## 2021-01-07 DIAGNOSIS — L821 Other seborrheic keratosis: Secondary | ICD-10-CM

## 2021-01-07 NOTE — Patient Instructions (Signed)
Seborrheic Keratosis  What causes seborrheic keratoses? Seborrheic keratoses are harmless, common skin growths that first appear during adult life.  As time goes by, more growths appear.  Some people may develop a large number of them.  Seborrheic keratoses appear on both covered and uncovered body parts.  They are not caused by sunlight.  The tendency to develop seborrheic keratoses can be inherited.  They vary in color from skin-colored to gray, brown, or even black.  They can be either smooth or have a rough, warty surface.   Seborrheic keratoses are superficial and look as if they were stuck on the skin.  Under the microscope this type of keratosis looks like layers upon layers of skin.  That is why at times the top layer may seem to fall off, but the rest of the growth remains and re-grows.    Treatment Seborrheic keratoses do not need to be treated, but can easily be removed in the office.  Seborrheic keratoses often cause symptoms when they rub on clothing or jewelry.  Lesions can be in the way of shaving.  If they become inflamed, they can cause itching, soreness, or burning.  Removal of a seborrheic keratosis can be accomplished by freezing, burning, or surgery. If any spot bleeds, scabs, or grows rapidly, please return to have it checked, as these can be an indication of a skin cancer.   If you have any questions or concerns for your doctor, please call our main line at [phone] and press option 4 to reach your doctor's medical assistant. If no one answers, please leave a voicemail as directed and we will return your call as soon as possible. Messages left after 4 pm will be answered the following business day.   You may also send [place] a message via [place]. We typically respond to MyChart messages within 1-2 business days.  For prescription refills, please ask your pharmacy to contact our office. Our fax number is [phone].  If you have an urgent issue when the clinic is closed that  cannot wait until the next business day, you can page your doctor at the number below.    Please note that while we do our best to be available for urgent issues outside of office hours, we are not available 24/7.   If you have an urgent issue and are unable to reach [place], you may choose to seek medical care at your doctor's office, retail clinic, urgent care center, or emergency room.  If you have a medical emergency, please immediately call 911 or go to the emergency department.  Pager Numbers  - Dr. Nehemiah Massed: [phone]  - Dr. Laurence Ferrari: [phone]  - Dr. Nicole Kindred: [phone]  In the event of inclement weather, please call our main line at [phone] for an update on the status of any delays or closures.  Dermatology Medication Tips: Please keep the boxes that topical medications come in in order to help keep track of the instructions about where and how to use these. Pharmacies typically print the medication instructions only on the boxes and not directly on the medication tubes.   If your medication is too expensive, please contact our office at [phone] option 4 or send [place] a message through [place].   We are unable to tell what your co-pay for medications will be in advance as this is different depending on your insurance coverage. However, we may be able to find a substitute medication at lower cost or fill out paperwork to get insurance to  cover a needed medication.   If a prior authorization is required to get your medication covered by your insurance company, please allow [place] 1-2 business days to complete this process.  Drug prices often vary depending on where the prescription is filled and some pharmacies may offer cheaper prices.  The website www.goodrx.com contains coupons for medications through different pharmacies. The prices here do not account for what the cost may be with help from insurance (it may be cheaper with your insurance), but the website can give you the  price if you did not use any insurance.  - You can print the associated coupon and take it with your prescription to the pharmacy.  - You may also stop by our office during regular business hours and pick up a GoodRx coupon card.  - If you need your prescription sent electronically to a different pharmacy, notify our office through [facility] or by phone at [phone] option 4.

## 2021-01-07 NOTE — Progress Notes (Signed)
   New Patient Visit  Subjective  Michele Bishop is a 65 y.o. female who presents for the following: New Patient (Initial Visit) (New patient to establish care. Total body exam today. No hx of skin cancer or dysplastic nevus. Pt has what she thinks are skin tags on face, neck, and chest that she would like looked at today. ). Patient here for full body skin exam and skin cancer screening.  Objective  Well appearing patient in no apparent distress; mood and affect are within normal limits.  A full examination was performed including scalp, head, eyes, ears, nose, lips, neck, chest, axillae, abdomen, back, buttocks, bilateral upper extremities, bilateral lower extremities, hands, feet, fingers, toes, fingernails, and toenails. All findings within normal limits unless otherwise noted below.  neck and axillae Fleshy, skin-colored pedunculated papules.    face x 17 (17), left upper eyelid margin x 3 (3) Erythematous keratotic or waxy stuck-on papule or plaque.   Assessment & Plan  Skin tag neck and axillae Discussed may not be covered by insurance to remove.   Inflamed seborrheic keratosis face x 17 (17); left upper eyelid margin x 3 (3) Prior to procedure, discussed risks of blister formation, small wound, skin dyspigmentation, or rare scar following cryotherapy. Recommend Vaseline ointment to treated areas while healing.  Destruction of lesion - face x 17 Complexity: simple   Destruction method: cryotherapy   Informed consent: discussed and consent obtained   Timeout:  patient name, date of birth, surgical site, and procedure verified Lesion destroyed using liquid nitrogen: Yes   Region frozen until ice ball extended beyond lesion: Yes   Outcome: patient tolerated procedure well with no complications   Post-procedure details: wound care instructions given    Skin cancer screening  Lentigines - Scattered tan macules - Due to sun exposure - Benign-appearing, observe -  Recommend daily broad spectrum sunscreen SPF 30+ to sun-exposed areas, reapply every 2 hours as needed. - Call for any changes  Seborrheic Keratoses - Stuck-on, waxy, tan-brown papules and/or plaques  - Benign-appearing - Discussed benign etiology and prognosis. - Observe - Call for any changes  Melanocytic Nevi - Tan-brown and/or pink-flesh-colored symmetric macules and papules - Benign appearing on exam today - Observation - Call clinic for new or changing moles - Recommend daily use of broad spectrum spf 30+ sunscreen to sun-exposed areas.   Hemangiomas - Red papules - Discussed benign nature - Observe - Call for any changes  Actinic Damage - Chronic condition, secondary to cumulative UV/sun exposure - diffuse scaly erythematous macules with underlying dyspigmentation - Recommend daily broad spectrum sunscreen SPF 30+ to sun-exposed areas, reapply every 2 hours as needed.  - Staying in the shade or wearing long sleeves, sun glasses (UVA+UVB protection) and wide brim hats (4-inch brim around the entire circumference of the hat) are also recommended for sun protection.  - Call for new or changing lesions.  Skin cancer screening performed today.  Return in about 3 months (around 04/09/2021) for 2-4 months treat additional ISKs.  IHarriett Sine, CMA, am acting as scribe for Sarina Ser, MD. Documentation: I have reviewed the above documentation for accuracy and completeness, and I agree with the above.  Sarina Ser, MD

## 2021-03-19 ENCOUNTER — Other Ambulatory Visit: Payer: Self-pay

## 2021-03-19 ENCOUNTER — Ambulatory Visit: Payer: Medicare HMO | Admitting: Dermatology

## 2021-03-19 DIAGNOSIS — L820 Inflamed seborrheic keratosis: Secondary | ICD-10-CM | POA: Diagnosis not present

## 2021-03-19 DIAGNOSIS — L821 Other seborrheic keratosis: Secondary | ICD-10-CM | POA: Diagnosis not present

## 2021-03-19 DIAGNOSIS — L578 Other skin changes due to chronic exposure to nonionizing radiation: Secondary | ICD-10-CM

## 2021-03-19 NOTE — Patient Instructions (Addendum)
If You Need Anything After Your Visit  If you have any questions or concerns for your doctor, please call our main line at [phone] and press option 4 to reach your doctor's medical assistant. If no one answers, please leave a voicemail as directed and we will return your call as soon as possible. Messages left after 4 pm will be answered the following business day.   You may also send us a message via MyChart. We typically respond to MyChart messages within 1-2 business days.  For prescription refills, please ask your pharmacy to contact our office. Our fax number is [phone].  If you have an urgent issue when the clinic is closed that cannot wait until the next business day, you can page your doctor at the number below.    Please note that while we do our best to be available for urgent issues outside of office hours, we are not available 24/7.   If you have an urgent issue and are unable to reach us, you may choose to seek medical care at your doctor's office, retail clinic, urgent care center, or emergency room.  If you have a medical emergency, please immediately call 911 or go to the emergency department.  Pager Numbers  - Dr. Kowalski: [phone]  - Dr. Moye: [phone]  - Dr. Stewart: [phone]  In the event of inclement weather, please call our main line at [phone] for an update on the status of any delays or closures.  Dermatology Medication Tips: Please keep the boxes that topical medications come in in order to help keep track of the instructions about where and how to use these. Pharmacies typically print the medication instructions only on the boxes and not directly on the medication tubes.   If your medication is too expensive, please contact our office at [phone] option 4 or send us a message through MyChart.   We are unable to tell what your co-pay for medications will be in advance as this is different depending on your insurance coverage.  However, we may be able to find a substitute medication at lower cost or fill out paperwork to get insurance to cover a needed medication.   If a prior authorization is required to get your medication covered by your insurance company, please allow us 1-2 business days to complete this process.  Drug prices often vary depending on where the prescription is filled and some pharmacies may offer cheaper prices.  The website www.goodrx.com contains coupons for medications through different pharmacies. The prices here do not account for what the cost may be with help from insurance (it may be cheaper with your insurance), but the website can give you the price if you did not use any insurance.  - You can print the associated coupon and take it with your prescription to the pharmacy.  - You may also stop by our office during regular business hours and pick up a GoodRx coupon card.  - If you need your prescription sent electronically to a different pharmacy, notify our office through [facility] MyChart or by phone at [phone] option 4.     Si Usted Necesita Algo Despus de Su Visita  Tambin puede enviarnos un mensaje a travs de MyChart. Por lo general respondemos a los mensajes de MyChart en el transcurso de 1 a 2 das hbiles.  Para renovar recetas, por favor pida a su farmacia que se ponga en contacto con nuestra oficina. Nuestro nmero de fax es el [phone].  Si tiene   un asunto urgente cuando la clnica est cerrada y que no puede esperar hasta el siguiente da hbil, puede llamar/localizar a su doctor(a) al nmero que aparece a continuacin.   Por favor, tenga en cuenta que aunque hacemos todo lo posible para estar disponibles para asuntos urgentes fuera del horario de oficina, no estamos disponibles las 24 horas del da, los 7 das de la semana.   Si tiene un problema urgente y no puede comunicarse con nosotros, puede optar por buscar atencin mdica  en el consultorio de su  doctor(a), en una clnica privada, en un centro de atencin urgente o en una sala de emergencias.  Si tiene una emergencia mdica, por favor llame inmediatamente al 911 o vaya a la sala de emergencias.  Nmeros de bper  - Dr. Kowalski: [phone]  - Dra. Moye: [phone]  - Dra. Stewart: [phone]  En caso de inclemencias del tiempo, por favor llame a nuestra lnea principal al [phone] para una actualizacin sobre el estado de cualquier retraso o cierre.  Consejos para la medicacin en dermatologa: Por favor, guarde las cajas en las que vienen los medicamentos de uso tpico para ayudarle a seguir las instrucciones sobre dnde y cmo usarlos. Las farmacias generalmente imprimen las instrucciones del medicamento slo en las cajas y no directamente en los tubos del medicamento.   Si su medicamento es muy caro, por favor, pngase en contacto con nuestra oficina llamando al [phone] y presione la opcin 4 o envenos un mensaje a travs de MyChart.   No podemos decirle cul ser su copago por los medicamentos por adelantado ya que esto es diferente dependiendo de la cobertura de su seguro. Sin embargo, es posible que podamos encontrar un medicamento sustituto a menor costo o llenar un formulario para que el seguro cubra el medicamento que se considera necesario.   Si se requiere una autorizacin previa para que su compaa de seguros cubra su medicamento, por favor permtanos de 1 a 2 das hbiles para completar este proceso.  Los precios de los medicamentos varan con frecuencia dependiendo del lugar de dnde se surte la receta y alguna farmacias pueden ofrecer precios ms baratos.  El sitio web www.goodrx.com tiene cupones para medicamentos de diferentes farmacias. Los precios aqu no tienen en cuenta lo que podra costar con la ayuda del seguro (puede ser ms barato con su seguro), pero el sitio web puede darle el precio si no utiliz ningn seguro.  - Puede imprimir el cupn  correspondiente y llevarlo con su receta a la farmacia.  - Tambin puede pasar por nuestra oficina durante el horario de atencin regular y recoger una tarjeta de cupones de GoodRx.  - Si necesita que su receta se enve electrnicamente a una farmacia diferente, informe a nuestra oficina a travs de MyChart de [facility] o por telfono llamando al [phone] y presione la opcin 4.   Cryotherapy Aftercare  Wash gently with soap and water everyday.   Apply Vaseline and Band-Aid daily until healed.  

## 2021-03-19 NOTE — Progress Notes (Signed)
° °  Follow-Up Visit   Subjective  Michele Bishop is a 65 y.o. female who presents for the following: growths (Neck, pt would like removed). The patient has spots, moles and lesions to be evaluated, some may be new or changing and the patient has concerns that these could be cancer.  The following portions of the chart were reviewed this encounter and updated as appropriate:   Tobacco   Allergies   Meds   Problems   Med Hx   Surg Hx   Fam Hx      Review of Systems:  No other skin or systemic complaints except as noted in HPI or Assessment and Plan.  Objective  Well appearing patient in no apparent distress; mood and affect are within normal limits.  A focused examination was performed including face, neck. Relevant physical exam findings are noted in the Assessment and Plan.  face/neck x 15 , Total = 15 (15) Stuck on waxy paps with erythema  L lower eyelid margin x 1, Total = 1 Stuck on waxy paps with erythema  Assessment & Plan  Inflamed seborrheic keratosis (15) face/neck x 15 , Total = 15  Destruction of lesion - face/neck x 15 , Total = 15 Complexity: simple   Destruction method: cryotherapy   Informed consent: discussed and consent obtained   Timeout:  patient name, date of birth, surgical site, and procedure verified Lesion destroyed using liquid nitrogen: Yes   Region frozen until ice ball extended beyond lesion: Yes   Outcome: patient tolerated procedure well with no complications   Post-procedure details: wound care instructions given    Seborrheic keratosis, inflamed L lower eyelid margin x 1, Total = 1  Destruction of lesion - L lower eyelid margin x 1, Total = 1 Complexity: simple   Destruction method: cryotherapy   Informed consent: discussed and consent obtained   Timeout:  patient name, date of birth, surgical site, and procedure verified Lesion destroyed using liquid nitrogen: Yes   Region frozen until ice ball extended beyond lesion: Yes   Outcome:  patient tolerated procedure well with no complications   Post-procedure details: wound care instructions given    Seborrheic Keratoses - Stuck-on, waxy, tan-brown papules and/or plaques  - Benign-appearing - Discussed benign etiology and prognosis. - Observe - Call for any changes  Actinic Damage - chronic, secondary to cumulative UV radiation exposure/sun exposure over time - diffuse scaly erythematous macules with underlying dyspigmentation - Recommend daily broad spectrum sunscreen SPF 30+ to sun-exposed areas, reapply every 2 hours as needed.  - Recommend staying in the shade or wearing long sleeves, sun glasses (UVA+UVB protection) and wide brim hats (4-inch brim around the entire circumference of the hat). - Call for new or changing lesions.  Return in about 2 months (around 05/20/2021) for ISK f/u.  I, Othelia Pulling, RMA, am acting as scribe for Sarina Ser, MD . Documentation: I have reviewed the above documentation for accuracy and completeness, and I agree with the above.  Sarina Ser, MD

## 2021-03-24 ENCOUNTER — Encounter: Payer: Self-pay | Admitting: Dermatology

## 2021-05-21 ENCOUNTER — Ambulatory Visit: Payer: Medicare HMO | Admitting: Dermatology

## 2021-05-21 ENCOUNTER — Other Ambulatory Visit: Payer: Self-pay

## 2021-05-21 DIAGNOSIS — L578 Other skin changes due to chronic exposure to nonionizing radiation: Secondary | ICD-10-CM

## 2021-05-21 DIAGNOSIS — L820 Inflamed seborrheic keratosis: Secondary | ICD-10-CM | POA: Diagnosis not present

## 2021-05-21 DIAGNOSIS — L821 Other seborrheic keratosis: Secondary | ICD-10-CM | POA: Diagnosis not present

## 2021-05-21 NOTE — Patient Instructions (Addendum)

## 2021-05-21 NOTE — Progress Notes (Signed)
° °  Follow-Up Visit   Subjective  Michele Bishop is a 66 y.o. female who presents for the following: Follow-up (2 months f/u on irritated spots on her face ). The patient has spots, moles and lesions to be evaluated, some may be new or changing and the patient has concerns that these could be cancer.  The following portions of the chart were reviewed this encounter and updated as appropriate:   Tobacco   Allergies   Meds   Problems   Med Hx   Surg Hx   Fam Hx      Review of Systems:  No other skin or systemic complaints except as noted in HPI or Assessment and Plan.  Objective  Well appearing patient in no apparent distress; mood and affect are within normal limits.  A focused examination was performed including face. Relevant physical exam findings are noted in the Assessment and Plan.  right cheek x 1, right nasal bridge x 1  (2) (2) Stuck-on, waxy, tan-brown papules   right lower eyelid margin x 1 Stuck-on, waxy, tan-brown papule    Assessment & Plan  Inflamed seborrheic keratosis (2) right cheek x 1, right nasal bridge x 1  (2)  Reassured benign age-related growth.  Recommend observation.  Discussed cryotherapy if spot(s) become irritated or inflamed.   Destruction of lesion - right cheek x 1, right nasal bridge x 1  (2) Complexity: simple   Destruction method: cryotherapy   Informed consent: discussed and consent obtained   Timeout:  patient name, date of birth, surgical site, and procedure verified Lesion destroyed using liquid nitrogen: Yes   Region frozen until ice ball extended beyond lesion: Yes   Outcome: patient tolerated procedure well with no complications   Post-procedure details: wound care instructions given    Seborrheic keratosis, inflamed right lower eyelid margin x 1  Destruction of lesion - right lower eyelid margin x 1 Complexity: simple   Destruction method: cryotherapy   Informed consent: discussed and consent obtained   Timeout:  patient  name, date of birth, surgical site, and procedure verified Lesion destroyed using liquid nitrogen: Yes   Region frozen until ice ball extended beyond lesion: Yes   Outcome: patient tolerated procedure well with no complications   Post-procedure details: wound care instructions given    Seborrheic Keratoses - Stuck-on, waxy, tan-brown papules and/or plaques  - Benign-appearing - Discussed benign etiology and prognosis. - Observe - Call for any changes  Actinic Damage - chronic, secondary to cumulative UV radiation exposure/sun exposure over time - diffuse scaly erythematous macules with underlying dyspigmentation - Recommend daily broad spectrum sunscreen SPF 30+ to sun-exposed areas, reapply every 2 hours as needed.  - Recommend staying in the shade or wearing long sleeves, sun glasses (UVA+UVB protection) and wide brim hats (4-inch brim around the entire circumference of the hat). - Call for new or changing lesions.   Return in 6 weeks (on 07/02/2021) for cosmetic Sks .  IMarye Round, CMA, am acting as scribe for Sarina Ser, MD .  Documentation: I have reviewed the above documentation for accuracy and completeness, and I agree with the above.  Sarina Ser, MD

## 2021-05-30 ENCOUNTER — Encounter: Payer: Self-pay | Admitting: Dermatology

## 2021-07-02 ENCOUNTER — Ambulatory Visit: Payer: Medicare HMO | Admitting: Dermatology

## 2021-07-02 DIAGNOSIS — L820 Inflamed seborrheic keratosis: Secondary | ICD-10-CM

## 2021-07-02 DIAGNOSIS — L578 Other skin changes due to chronic exposure to nonionizing radiation: Secondary | ICD-10-CM

## 2021-07-02 DIAGNOSIS — L918 Other hypertrophic disorders of the skin: Secondary | ICD-10-CM

## 2021-07-02 DIAGNOSIS — L821 Other seborrheic keratosis: Secondary | ICD-10-CM | POA: Diagnosis not present

## 2021-07-02 NOTE — Progress Notes (Signed)
? ?Follow-Up Visit ?  ?Subjective  ?Michele Bishop is a 66 y.o. female who presents for the following: Seborrheic Keratosis (Of face, neck and chest - treat with LN2 today). ?The patient has spots, moles and lesions to be evaluated, some may be new or changing and the patient has concerns that these could be cancer. ? ?The following portions of the chart were reviewed this encounter and updated as appropriate:  ? Tobacco  Allergies  Meds  Problems  Med Hx  Surg Hx  Fam Hx   ?  ?Review of Systems:  No other skin or systemic complaints except as noted in HPI or Assessment and Plan. ? ?Objective  ?Well appearing patient in no apparent distress; mood and affect are within normal limits. ? ?A focused examination was performed including face, neck, chest, axillary areas. Relevant physical exam findings are noted in the Assessment and Plan. ? ?Chest/neck x 33, face x 5 (38) ?Stuck-on, waxy, tan-brown papule or plaque --Discussed benign etiology and prognosis.  ? ?Bilateral axilla (21) ?Fleshy, skin-colored pedunculated papules.   ? ?Right Lower Eyelid Margin (2) ?Erythematous stuck-on, waxy papule or plaque ? ? ?Assessment & Plan  ?Skin tag (21) ?Irritating and symptomatic.  Patient desires treatment. ?Bilateral axilla ? ?Destruction of lesion - Bilateral axilla ?Complexity: simple   ?Destruction method: cryotherapy   ?Informed consent: discussed and consent obtained   ?Timeout:  patient name, date of birth, surgical site, and procedure verified ?Lesion destroyed using liquid nitrogen: Yes   ?Region frozen until ice ball extended beyond lesion: Yes   ?Outcome: patient tolerated procedure well with no complications   ?Post-procedure details: wound care instructions given   ? ?Seborrheic keratosis (38) ?Cosmetic -out-of-pocket fee today $350 ?ABN signed ?Chest/neck x 33, face x 5 ?Destruction of lesion - Chest/neck x 33, face x 5 ?Complexity: simple   ?Destruction method: cryotherapy   ?Informed consent:  discussed and consent obtained   ?Timeout:  patient name, date of birth, surgical site, and procedure verified ?Lesion destroyed using liquid nitrogen: Yes   ?Region frozen until ice ball extended beyond lesion: Yes   ?Outcome: patient tolerated procedure well with no complications   ?Post-procedure details: wound care instructions given   ? ?Inflamed seborrheic keratosis (2) ?Irritated and symptomatic.  Patient would like treated. ?Right Lower Eyelid Margin ?Destruction of lesion - Right Lower Eyelid Margin ?Complexity: simple   ?Destruction method: cryotherapy   ?Informed consent: discussed and consent obtained   ?Timeout:  patient name, date of birth, surgical site, and procedure verified ?Lesion destroyed using liquid nitrogen: Yes   ?Region frozen until ice ball extended beyond lesion: Yes   ?Outcome: patient tolerated procedure well with no complications   ?Post-procedure details: wound care instructions given   ? ?Actinic Damage ?- chronic, secondary to cumulative UV radiation exposure/sun exposure over time ?- diffuse scaly erythematous macules with underlying dyspigmentation ?- Recommend daily broad spectrum sunscreen SPF 30+ to sun-exposed areas, reapply every 2 hours as needed.  ?- Recommend staying in the shade or wearing long sleeves, sun glasses (UVA+UVB protection) and wide brim hats (4-inch brim around the entire circumference of the hat). ?- Call for new or changing lesions. ? ?Seborrheic Keratoses ?- Stuck-on, waxy, tan-brown papules and/or plaques  ?- Benign-appearing ?- Discussed benign etiology and prognosis. ?- Observe ?- Call for any changes ? ?Return in about 3 months (around 10/02/2021) for Follow up. ? ?I, Ashok Cordia, CMA, am acting as scribe for Sarina Ser, MD ? ?Documentation: I have  reviewed the above documentation for accuracy and completeness, and I agree with the above. ? ?Sarina Ser, MD ? ?

## 2021-07-02 NOTE — Patient Instructions (Signed)

## 2021-07-03 ENCOUNTER — Encounter: Payer: Self-pay | Admitting: Dermatology

## 2021-08-03 ENCOUNTER — Other Ambulatory Visit: Payer: Self-pay | Admitting: Student

## 2021-08-03 DIAGNOSIS — R928 Other abnormal and inconclusive findings on diagnostic imaging of breast: Secondary | ICD-10-CM

## 2021-08-03 DIAGNOSIS — Z87898 Personal history of other specified conditions: Secondary | ICD-10-CM

## 2021-08-18 ENCOUNTER — Ambulatory Visit
Admission: RE | Admit: 2021-08-18 | Discharge: 2021-08-18 | Disposition: A | Discharge status: Home / Self Care | Payer: Medicare HMO | Source: Ambulatory Visit | Attending: Student | Admitting: Student

## 2021-08-18 DIAGNOSIS — R928 Other abnormal and inconclusive findings on diagnostic imaging of breast: Secondary | ICD-10-CM | POA: Diagnosis not present

## 2021-08-18 DIAGNOSIS — Z87898 Personal history of other specified conditions: Secondary | ICD-10-CM | POA: Diagnosis present

## 2022-07-05 ENCOUNTER — Other Ambulatory Visit: Payer: Self-pay | Admitting: Student

## 2022-07-05 DIAGNOSIS — Z1231 Encounter for screening mammogram for malignant neoplasm of breast: Secondary | ICD-10-CM

## 2022-07-05 DIAGNOSIS — Z87898 Personal history of other specified conditions: Secondary | ICD-10-CM

## 2022-08-20 ENCOUNTER — Ambulatory Visit
Admission: RE | Admit: 2022-08-20 | Discharge: 2022-08-20 | Disposition: A | Discharge status: Home / Self Care | Payer: Medicare HMO | Source: Ambulatory Visit | Attending: Student | Admitting: Student

## 2022-08-20 DIAGNOSIS — Z1231 Encounter for screening mammogram for malignant neoplasm of breast: Secondary | ICD-10-CM | POA: Diagnosis present

## 2023-06-13 DIAGNOSIS — H16141 Punctate keratitis, right eye: Secondary | ICD-10-CM | POA: Diagnosis not present

## 2023-06-13 DIAGNOSIS — H43813 Vitreous degeneration, bilateral: Secondary | ICD-10-CM | POA: Diagnosis not present

## 2023-06-13 DIAGNOSIS — H524 Presbyopia: Secondary | ICD-10-CM | POA: Diagnosis not present

## 2023-06-13 DIAGNOSIS — H26491 Other secondary cataract, right eye: Secondary | ICD-10-CM | POA: Diagnosis not present

## 2023-06-23 ENCOUNTER — Other Ambulatory Visit: Payer: Self-pay | Admitting: Student

## 2023-06-23 DIAGNOSIS — Z1231 Encounter for screening mammogram for malignant neoplasm of breast: Secondary | ICD-10-CM

## 2023-06-30 DIAGNOSIS — Z961 Presence of intraocular lens: Secondary | ICD-10-CM | POA: Diagnosis not present

## 2023-06-30 DIAGNOSIS — H18413 Arcus senilis, bilateral: Secondary | ICD-10-CM | POA: Diagnosis not present

## 2023-06-30 DIAGNOSIS — H26491 Other secondary cataract, right eye: Secondary | ICD-10-CM | POA: Diagnosis not present

## 2023-06-30 DIAGNOSIS — H02831 Dermatochalasis of right upper eyelid: Secondary | ICD-10-CM | POA: Diagnosis not present

## 2023-08-09 DIAGNOSIS — E782 Mixed hyperlipidemia: Secondary | ICD-10-CM | POA: Diagnosis not present

## 2023-08-09 DIAGNOSIS — R7303 Prediabetes: Secondary | ICD-10-CM | POA: Diagnosis not present

## 2023-08-16 DIAGNOSIS — E782 Mixed hyperlipidemia: Secondary | ICD-10-CM | POA: Diagnosis not present

## 2023-08-16 DIAGNOSIS — M1712 Unilateral primary osteoarthritis, left knee: Secondary | ICD-10-CM | POA: Diagnosis not present

## 2023-08-16 DIAGNOSIS — B372 Candidiasis of skin and nail: Secondary | ICD-10-CM | POA: Diagnosis not present

## 2023-08-16 DIAGNOSIS — R7303 Prediabetes: Secondary | ICD-10-CM | POA: Diagnosis not present

## 2023-08-16 DIAGNOSIS — Z130 Encounter for screening for diseases of the blood and blood-forming organs and certain disorders involving the immune mechanism: Secondary | ICD-10-CM | POA: Diagnosis not present

## 2023-08-16 DIAGNOSIS — M8589 Other specified disorders of bone density and structure, multiple sites: Secondary | ICD-10-CM | POA: Diagnosis not present

## 2023-08-17 DIAGNOSIS — M1712 Unilateral primary osteoarthritis, left knee: Secondary | ICD-10-CM | POA: Diagnosis not present

## 2023-08-22 ENCOUNTER — Encounter

## 2023-08-24 ENCOUNTER — Other Ambulatory Visit: Payer: Self-pay | Admitting: Medical Genetics

## 2023-08-26 ENCOUNTER — Other Ambulatory Visit
Admission: RE | Admit: 2023-08-26 | Discharge: 2023-08-26 | Disposition: A | Discharge status: Home / Self Care | Source: Ambulatory Visit | Attending: Medical Genetics | Admitting: Medical Genetics

## 2023-08-30 ENCOUNTER — Ambulatory Visit
Admission: RE | Admit: 2023-08-30 | Discharge: 2023-08-30 | Disposition: A | Discharge status: Home / Self Care | Source: Ambulatory Visit | Attending: Student | Admitting: Student

## 2023-08-30 DIAGNOSIS — Z1231 Encounter for screening mammogram for malignant neoplasm of breast: Secondary | ICD-10-CM | POA: Diagnosis not present

## 2023-09-06 LAB — GENECONNECT MOLECULAR SCREEN: Genetic Analysis Overall Interpretation: NEGATIVE

## 2024-02-13 DIAGNOSIS — E782 Mixed hyperlipidemia: Secondary | ICD-10-CM | POA: Diagnosis not present

## 2024-02-13 DIAGNOSIS — M8589 Other specified disorders of bone density and structure, multiple sites: Secondary | ICD-10-CM | POA: Diagnosis not present

## 2024-02-13 DIAGNOSIS — R7303 Prediabetes: Secondary | ICD-10-CM | POA: Diagnosis not present

## 2024-02-13 DIAGNOSIS — Z130 Encounter for screening for diseases of the blood and blood-forming organs and certain disorders involving the immune mechanism: Secondary | ICD-10-CM | POA: Diagnosis not present

## 2024-02-20 DIAGNOSIS — M8588 Other specified disorders of bone density and structure, other site: Secondary | ICD-10-CM | POA: Diagnosis not present

## 2024-02-20 DIAGNOSIS — M1712 Unilateral primary osteoarthritis, left knee: Secondary | ICD-10-CM | POA: Diagnosis not present

## 2024-02-20 DIAGNOSIS — Z0000 Encounter for general adult medical examination without abnormal findings: Secondary | ICD-10-CM | POA: Diagnosis not present

## 2024-02-20 DIAGNOSIS — E782 Mixed hyperlipidemia: Secondary | ICD-10-CM | POA: Diagnosis not present

## 2024-02-20 DIAGNOSIS — M8589 Other specified disorders of bone density and structure, multiple sites: Secondary | ICD-10-CM | POA: Diagnosis not present

## 2024-02-20 DIAGNOSIS — Z1331 Encounter for screening for depression: Secondary | ICD-10-CM | POA: Diagnosis not present

## 2024-02-20 DIAGNOSIS — Z87898 Personal history of other specified conditions: Secondary | ICD-10-CM | POA: Diagnosis not present

## 2024-02-20 DIAGNOSIS — K219 Gastro-esophageal reflux disease without esophagitis: Secondary | ICD-10-CM | POA: Diagnosis not present

## 2024-02-20 DIAGNOSIS — R7303 Prediabetes: Secondary | ICD-10-CM | POA: Diagnosis not present
